# Patient Record
Sex: Female | Born: 1960
Health system: Southern US, Community
[De-identification: ages and names within clinical notes are randomized; demographics above are authoritative.]

## PROBLEM LIST (undated history)

## (undated) DIAGNOSIS — J3089 Other allergic rhinitis: Secondary | ICD-10-CM

## (undated) DIAGNOSIS — J339 Nasal polyp, unspecified: Secondary | ICD-10-CM

## (undated) DIAGNOSIS — E119 Type 2 diabetes mellitus without complications: Secondary | ICD-10-CM

## (undated) DIAGNOSIS — M1990 Unspecified osteoarthritis, unspecified site: Secondary | ICD-10-CM

## (undated) DIAGNOSIS — K56609 Unspecified intestinal obstruction, unspecified as to partial versus complete obstruction: Secondary | ICD-10-CM

## (undated) DIAGNOSIS — T4145XA Adverse effect of unspecified anesthetic, initial encounter: Secondary | ICD-10-CM

## (undated) DIAGNOSIS — I10 Essential (primary) hypertension: Secondary | ICD-10-CM

## (undated) DIAGNOSIS — T8859XA Other complications of anesthesia, initial encounter: Secondary | ICD-10-CM

## (undated) HISTORY — DX: Essential (primary) hypertension: I10

## (undated) HISTORY — PX: DEBRIDEMENT OF ABDOMINAL WALL ABSCESS: SHX6396

## (undated) HISTORY — PX: OTHER SURGICAL HISTORY: SHX169

## (undated) HISTORY — PX: CHOLECYSTECTOMY: SHX55

## (undated) HISTORY — PX: ABDOMINAL HYSTERECTOMY: SHX81

## (undated) HISTORY — DX: Unspecified osteoarthritis, unspecified site: M19.90

---

## 2013-08-28 ENCOUNTER — Ambulatory Visit (INDEPENDENT_AMBULATORY_CARE_PROVIDER_SITE_OTHER): Payer: Medicaid Other | Admitting: General Surgery

## 2013-08-28 ENCOUNTER — Encounter (INDEPENDENT_AMBULATORY_CARE_PROVIDER_SITE_OTHER): Payer: Self-pay | Admitting: General Surgery

## 2013-08-28 VITALS — BP 120/70 | HR 74 | Resp 16 | Ht 66.0 in | Wt 191.0 lb

## 2013-08-28 DIAGNOSIS — K429 Umbilical hernia without obstruction or gangrene: Secondary | ICD-10-CM

## 2013-08-28 DIAGNOSIS — K649 Unspecified hemorrhoids: Secondary | ICD-10-CM

## 2013-08-28 NOTE — Progress Notes (Signed)
Patient ID: Carrie Larson, female   DOB: 01-Mar-1961, 52 y.o.   MRN: [id number]  Chief Complaint  Patient presents with  . New Evaluation    eval hems and hernia    HPI Carrie Larson is a 52 y.o. female.  The patient is a 52 year old female who is referred by  Dr.  Teresa Pelton for evaluation of an abdominal hernia as well as hemorrhoids. Patient states she's had hemorrhoids for the last one to 2 years.  The patient also had hernia for the last 4 months. The patient the patient recently had a surgery into the approximately a year ago for what I was able to make out a potential hysterectomy. She states that 4 months after this she noticed a hernia at the umbilicus. She states she has no pain at the umbilicus.  In regards to her hemorrhoids the patient states that with her change in her diet she feels that she is more constipated with a change in her diet. The patient states she is not taking thyroid is not staying very hydrated this time. HPI  Past Medical History  Diagnosis Date  . Hypertension   . Arthritis     Past Surgical History  Procedure Laterality Date  . Cholecystectomy    . Abdominal hysterectomy      History reviewed. No pertinent family history.  Social History History  Substance Use Topics  . Smoking status: Never Smoker   . Smokeless tobacco: Never Used  . Alcohol Use: No    Allergies  Allergen Reactions  . Reglan [Metoclopramide]     Current Outpatient Prescriptions  Medication Sig Dispense Refill  . captopril (CAPOTEN) 12.5 MG tablet Take 12.5 mg by mouth 1 day or 1 dose.      . Meclizine HCl (MEDI-MECLIZINE PO) Take by mouth daily.       No current facility-administered medications for this visit.    Review of Systems Review of Systems  Constitutional: Negative.   HENT: Negative.   Respiratory: Negative.   Cardiovascular: Negative.   Gastrointestinal: Negative.   Neurological: Negative.   All other systems reviewed and are negative.    Blood  pressure 120/70, pulse 74, resp. rate 16, height 5\' 6"  (1.676 m), weight 191 lb (86.637 kg).  Physical Exam Physical Exam  Constitutional: She is oriented to person, place, and time. She appears well-developed and well-nourished.  HENT:  Head: Normocephalic and atraumatic.  Eyes: Conjunctivae and EOM are normal. Pupils are equal, round, and reactive to light.  Neck: Neck supple.  Cardiovascular: Normal rate, regular rhythm and normal heart sounds.   Pulmonary/Chest: Effort normal and breath sounds normal.  Abdominal: Soft. Bowel sounds are normal. A hernia is present.    Musculoskeletal: Normal range of motion.  Neurological: She is alert and oriented to person, place, and time.  Skin: Skin is warm and dry.    Data Reviewed None   Assessment    52 year old female with an umbilical incisional hernia.patient also with hemorrhoids.     Plan    1. Discuss with patient the pathophysiology of hemorrhoids and the need to increase her water intake as well as fiber. I believe this will help her with her hemorrhoids at this time. The patient was given a cream for hemorrhoids which he continues remain or inflamed. 2. The umbilical incisional hernia and is not give the patient a pain at this time is relatively small. I think at this time secondary to the patient's weight in the last hold off on  his surgery. Should he become more symptomatic we can discuss repair at that time. I discussed with the patient the signs and symptoms incarceration and strangulation and the need to present to the ER should these occur. The patient voiced understanding and is okay with this plan.       Marigene Ehlers., Wolfgang Finigan 08/28/2013, 2:46 PM

## 2013-09-07 ENCOUNTER — Encounter (INDEPENDENT_AMBULATORY_CARE_PROVIDER_SITE_OTHER): Payer: Self-pay

## 2013-09-22 ENCOUNTER — Encounter (INDEPENDENT_AMBULATORY_CARE_PROVIDER_SITE_OTHER): Payer: Self-pay

## 2014-02-21 DIAGNOSIS — M1990 Unspecified osteoarthritis, unspecified site: Secondary | ICD-10-CM

## 2014-02-21 HISTORY — PX: COLONOSCOPY: SHX174

## 2014-02-21 HISTORY — DX: Unspecified osteoarthritis, unspecified site: M19.90

## 2014-08-03 ENCOUNTER — Other Ambulatory Visit (HOSPITAL_BASED_OUTPATIENT_CLINIC_OR_DEPARTMENT_OTHER): Payer: Self-pay | Admitting: Internal Medicine

## 2014-08-03 DIAGNOSIS — R1084 Generalized abdominal pain: Secondary | ICD-10-CM

## 2014-08-05 ENCOUNTER — Ambulatory Visit (HOSPITAL_BASED_OUTPATIENT_CLINIC_OR_DEPARTMENT_OTHER)
Admission: RE | Admit: 2014-08-05 | Discharge: 2014-08-05 | Disposition: A | Discharge status: Home / Self Care | Payer: Self-pay | Source: Ambulatory Visit | Attending: Internal Medicine | Admitting: Internal Medicine

## 2014-08-05 DIAGNOSIS — R1084 Generalized abdominal pain: Secondary | ICD-10-CM | POA: Insufficient documentation

## 2014-11-10 ENCOUNTER — Other Ambulatory Visit: Payer: Self-pay | Admitting: Internal Medicine

## 2014-11-10 DIAGNOSIS — Z1231 Encounter for screening mammogram for malignant neoplasm of breast: Secondary | ICD-10-CM

## 2015-02-04 ENCOUNTER — Other Ambulatory Visit (HOSPITAL_COMMUNITY): Payer: Self-pay | Admitting: Internal Medicine

## 2015-02-04 DIAGNOSIS — Z1231 Encounter for screening mammogram for malignant neoplasm of breast: Secondary | ICD-10-CM

## 2015-02-16 ENCOUNTER — Ambulatory Visit (HOSPITAL_COMMUNITY)
Admission: RE | Admit: 2015-02-16 | Discharge: 2015-02-16 | Disposition: A | Discharge status: Home / Self Care | Payer: BLUE CROSS/BLUE SHIELD | Source: Ambulatory Visit | Attending: Internal Medicine | Admitting: Internal Medicine

## 2015-02-16 DIAGNOSIS — Z1231 Encounter for screening mammogram for malignant neoplasm of breast: Secondary | ICD-10-CM | POA: Diagnosis not present

## 2015-02-18 ENCOUNTER — Other Ambulatory Visit: Payer: Self-pay | Admitting: Internal Medicine

## 2015-02-18 DIAGNOSIS — R928 Other abnormal and inconclusive findings on diagnostic imaging of breast: Secondary | ICD-10-CM

## 2015-02-22 DIAGNOSIS — K56609 Unspecified intestinal obstruction, unspecified as to partial versus complete obstruction: Secondary | ICD-10-CM

## 2015-02-22 HISTORY — DX: Unspecified intestinal obstruction, unspecified as to partial versus complete obstruction: K56.609

## 2015-03-02 ENCOUNTER — Ambulatory Visit
Admission: RE | Admit: 2015-03-02 | Discharge: 2015-03-02 | Disposition: A | Discharge status: Home / Self Care | Payer: BLUE CROSS/BLUE SHIELD | Source: Ambulatory Visit | Attending: Internal Medicine | Admitting: Internal Medicine

## 2015-03-02 ENCOUNTER — Other Ambulatory Visit: Payer: Self-pay | Admitting: Internal Medicine

## 2015-03-02 DIAGNOSIS — R928 Other abnormal and inconclusive findings on diagnostic imaging of breast: Secondary | ICD-10-CM

## 2015-03-07 ENCOUNTER — Inpatient Hospital Stay (HOSPITAL_BASED_OUTPATIENT_CLINIC_OR_DEPARTMENT_OTHER)
Admission: EM | Admit: 2015-03-07 | Discharge: 2015-03-11 | DRG: 389 | Disposition: A | Discharge status: Home / Self Care | Payer: BLUE CROSS/BLUE SHIELD | Attending: Internal Medicine | Admitting: Internal Medicine

## 2015-03-07 ENCOUNTER — Encounter (HOSPITAL_BASED_OUTPATIENT_CLINIC_OR_DEPARTMENT_OTHER): Payer: Self-pay | Admitting: Emergency Medicine

## 2015-03-07 ENCOUNTER — Emergency Department (HOSPITAL_BASED_OUTPATIENT_CLINIC_OR_DEPARTMENT_OTHER): Payer: BLUE CROSS/BLUE SHIELD

## 2015-03-07 DIAGNOSIS — R109 Unspecified abdominal pain: Secondary | ICD-10-CM | POA: Diagnosis not present

## 2015-03-07 DIAGNOSIS — Z9049 Acquired absence of other specified parts of digestive tract: Secondary | ICD-10-CM | POA: Diagnosis present

## 2015-03-07 DIAGNOSIS — J339 Nasal polyp, unspecified: Secondary | ICD-10-CM | POA: Diagnosis present

## 2015-03-07 DIAGNOSIS — K5090 Crohn's disease, unspecified, without complications: Secondary | ICD-10-CM | POA: Diagnosis present

## 2015-03-07 DIAGNOSIS — N390 Urinary tract infection, site not specified: Secondary | ICD-10-CM | POA: Diagnosis present

## 2015-03-07 DIAGNOSIS — K5660 Unspecified intestinal obstruction: Secondary | ICD-10-CM | POA: Diagnosis not present

## 2015-03-07 DIAGNOSIS — E119 Type 2 diabetes mellitus without complications: Secondary | ICD-10-CM

## 2015-03-07 DIAGNOSIS — K56609 Unspecified intestinal obstruction, unspecified as to partial versus complete obstruction: Secondary | ICD-10-CM

## 2015-03-07 DIAGNOSIS — Z90710 Acquired absence of both cervix and uterus: Secondary | ICD-10-CM

## 2015-03-07 DIAGNOSIS — I10 Essential (primary) hypertension: Secondary | ICD-10-CM | POA: Diagnosis present

## 2015-03-07 DIAGNOSIS — R52 Pain, unspecified: Secondary | ICD-10-CM

## 2015-03-07 HISTORY — DX: Type 2 diabetes mellitus without complications: E11.9

## 2015-03-07 HISTORY — DX: Adverse effect of unspecified anesthetic, initial encounter: T41.45XA

## 2015-03-07 HISTORY — DX: Other allergic rhinitis: J30.89

## 2015-03-07 HISTORY — DX: Unspecified intestinal obstruction, unspecified as to partial versus complete obstruction: K56.609

## 2015-03-07 HISTORY — DX: Other complications of anesthesia, initial encounter: T88.59XA

## 2015-03-07 HISTORY — DX: Nasal polyp, unspecified: J33.9

## 2015-03-07 NOTE — ED Notes (Signed)
Pt states she has had abd pain since this morning with n/v

## 2015-03-07 NOTE — ED Provider Notes (Signed)
CSN: [id number]     Arrival date & time 03/07/15  2115 History  This chart was scribed for Carrie Selvy, MD by Freida Busman, ED Scribe. This patient was seen in room MH10/MH10 and the patient's care was started 11:18 PM.    Chief Complaint  Patient presents with  . Abdominal Pain    Patient is a 54 y.o. female presenting with abdominal pain. The history is provided by the patient. No language interpreter was used.  Abdominal Pain Pain severity:  Moderate Duration:  1 day Timing:  Constant Progression:  Worsening Chronicity:  Recurrent Relieved by:  None tried Worsened by:  Nothing tried Ineffective treatments:  None tried Associated symptoms: anorexia, nausea and vomiting   Associated symptoms: no diarrhea and no fever      HPI Comments:  Carrie Larson is a 54 y.o. female who presents to the Emergency Department complaining of constant moderate abdominal pain that started this AM. She reports decreased PO intake and associated nausea and vomiting. She denies diarrhea. No alleviating factors noted. Pt reports a h/o same pain 2 months ago but was not evaluated due to lack of insurance.  Pt is compliant with her daily meds.    Past Medical History  Diagnosis Date  . Hypertension   . Diabetes mellitus without complication    Past Surgical History  Procedure Laterality Date  . Cholecystectomy    . Abdominal hysterectomy     History reviewed. No pertinent family history. History  Substance Use Topics  . Smoking status: Never Smoker   . Smokeless tobacco: Not on file  . Alcohol Use: No   OB History    No data available     Review of Systems  Constitutional: Negative for fever.  Gastrointestinal: Positive for nausea, vomiting, abdominal pain and anorexia. Negative for diarrhea.  All other systems reviewed and are negative.     Allergies  Review of patient's allergies indicates no known allergies.  Home Medications   Prior to Admission medications   Medication  Sig Start Date End Date Taking? Authorizing Provider  LISINOPRIL PO Take by mouth.   Yes Historical Provider, MD  METFORMIN HCL PO Take by mouth.   Yes Historical Provider, MD  metoCLOPramide (REGLAN) 10 MG tablet Take 10 mg by mouth 4 (four) times daily.   Yes Historical Provider, MD   BP 132/79 mmHg  Pulse 97  Temp(Src) 98.1 F (36.7 C) (Oral)  Resp 18  Ht 5\' 7"  (1.702 m)  Wt 177 lb (80.287 kg)  BMI 27.72 kg/m2  SpO2 100% Physical Exam  Constitutional: She is oriented to person, place, and time. She appears well-developed and well-nourished. No distress.  HENT:  Head: Normocephalic and atraumatic.  Mouth/Throat: Oropharynx is clear and moist.  Eyes: Conjunctivae are normal. Pupils are equal, round, and reactive to light.  Neck: Normal range of motion. Neck supple.  Cardiovascular: Normal rate, regular rhythm and normal heart sounds.   Pulmonary/Chest: Effort normal and breath sounds normal. No respiratory distress.  Abdominal: Soft. She exhibits no distension and no mass. There is no tenderness. There is no rebound and no guarding.  Hyperactive bowel sounds throughout  Musculoskeletal: Normal range of motion.  Neurological: She is alert and oriented to person, place, and time.  Skin: Skin is warm and dry.  Psychiatric: She has a normal mood and affect. Her behavior is normal.  Nursing note and vitals reviewed.   ED Course  Procedures   DIAGNOSTIC STUDIES:  Oxygen Saturation is 100% on  RA, normal by my interpretation.    COORDINATION OF CARE:  11:24 PM Discussed treatment plan with pt at bedside and pt agreed to plan.  Labs Review Labs Reviewed  URINALYSIS, ROUTINE W REFLEX MICROSCOPIC    Imaging Review No results found.   EKG Interpretation None      MDM   Final diagnoses:  None     EKG Interpretation  Date/Time:  Tuesday March 08 2015 00:13:34 EDT Ventricular Rate:  82 PR Interval:  142 QRS Duration: 80 QT Interval:  360 QTC Calculation: 420 R  Axis:   79 Text Interpretation:  Normal sinus rhythm Confirmed by [facility]  MD, Curtez Brallier ([id number]) on 03/08/2015 12:26:24 AM      Case d/w Dr. Harlon Flor: the management of this illness is non operative Results for orders placed or performed during the hospital encounter of 03/07/15  Urinalysis, Routine w reflex microscopic  Result Value Ref Range   Color, Urine ORANGE (A) YELLOW   APPearance CLOUDY (A) CLEAR   Specific Gravity, Urine 1.037 (H) 1.005 - 1.030   pH 5.5 5.0 - 8.0   Glucose, UA NEGATIVE NEGATIVE mg/dL   Hgb urine dipstick SMALL (A) NEGATIVE   Bilirubin Urine SMALL (A) NEGATIVE   Ketones, ur >80 (A) NEGATIVE mg/dL   Protein, ur 604 (A) NEGATIVE mg/dL   Urobilinogen, UA 1.0 0.0 - 1.0 mg/dL   Nitrite NEGATIVE NEGATIVE   Leukocytes, UA SMALL (A) NEGATIVE  CBC with Differential/Platelet  Result Value Ref Range   WBC 17.5 (H) 4.0 - 10.5 K/uL   RBC 5.36 (H) 3.87 - 5.11 MIL/uL   Hemoglobin 14.8 12.0 - 15.0 g/dL   HCT 54.0 98.1 - 19.1 %   MCV 82.8 78.0 - 100.0 fL   MCH 27.6 26.0 - 34.0 pg   MCHC 33.3 30.0 - 36.0 g/dL   RDW 47.8 29.5 - 62.1 %   Platelets 410 (H) 150 - 400 K/uL   Neutrophils Relative % 90 (H) 43 - 77 %   Neutro Abs 15.8 (H) 1.7 - 7.7 K/uL   Lymphocytes Relative 4 (L) 12 - 46 %   Lymphs Abs 0.6 (L) 0.7 - 4.0 K/uL   Monocytes Relative 6 3 - 12 %   Monocytes Absolute 1.1 (H) 0.1 - 1.0 K/uL   Eosinophils Relative 0 0 - 5 %   Eosinophils Absolute 0.0 0.0 - 0.7 K/uL   Basophils Relative 0 0 - 1 %   Basophils Absolute 0.0 0.0 - 0.1 K/uL  Lipase, blood  Result Value Ref Range   Lipase 20 11 - 59 U/L  Basic metabolic panel  Result Value Ref Range   Sodium 137 135 - 145 mmol/L   Potassium 3.7 3.5 - 5.1 mmol/L   Chloride 99 96 - 112 mmol/L   CO2 27 19 - 32 mmol/L   Glucose, Bld 215 (H) 70 - 99 mg/dL   BUN 12 6 - 23 mg/dL   Creatinine, Ser 3.08 0.50 - 1.10 mg/dL   Calcium 9.5 8.4 - 65.7 mg/dL   GFR calc non Af Amer >90 >90 mL/min   GFR calc Af Amer >90 >90  mL/min   Anion gap 11 5 - 15  Troponin I  Result Value Ref Range   Troponin I <0.03 <0.031 ng/mL  Urine microscopic-add on  Result Value Ref Range   Squamous Epithelial / LPF RARE RARE   WBC, UA 0-2 <3 WBC/hpf   RBC / HPF 3-6 <3 RBC/hpf   Bacteria, UA FEW (A)  RARE   Urine-Other MUCOUS PRESENT    Ct Abdomen Pelvis W Contrast  03/08/2015   CLINICAL DATA:  Acute onset of mid abdominal pain, nausea and vomiting. Initial encounter.  EXAM: CT ABDOMEN AND PELVIS WITH CONTRAST  TECHNIQUE: Multidetector CT imaging of the abdomen and pelvis was performed using the standard protocol following bolus administration of intravenous contrast.  CONTRAST:  25mL OMNIPAQUE IOHEXOL 300 MG/ML SOLN, OMNIPAQUE IOHEXOL 300 MG/ML SOLN  COMPARISON:  Abdominal radiograph performed earlier today at 12:15 a.m.  FINDINGS: Minimal bibasilar atelectasis is noted.  The liver and spleen are unremarkable in appearance. The patient is status post cholecystectomy. The pancreas and adrenal glands are unremarkable.  The kidneys are unremarkable in appearance. There is no evidence of hydronephrosis. No renal or ureteral stones are seen. No perinephric stranding is appreciated.  There is diffuse dilatation of small bowel loops throughout the abdomen and upper pelvis, measuring up to 4.4 cm in maximal diameter. This appears to reflect focal narrowing of the small bowel just distal to the dilated mid to distal ileum, with mildly increased wall enhancement and surrounding soft tissue inflammation.  Several additional segments of increased wall enhancement and soft tissue inflammation are seen along the mid and distal ileum, both proximal and distal to the site of obstruction. This raises suspicion for an underlying inflammatory small bowel process. Acute infectious ileitis or malignancy are considered less likely. There is no definite evidence for adhesion.  Trace free fluid is seen at the right mid abdomen, and tracking adjacent to small  bowel loops. A small amount of free fluid is noted tracking along the left paracolic gutter, into the pelvis.  The stomach is within normal limits. No acute vascular abnormalities are seen.  The appendix is normal in caliber, without evidence for appendicitis. The colon is largely decompressed and grossly unremarkable in appearance.  The bladder is decompressed and not well assessed. The patient is status post hysterectomy. The ovaries are relatively symmetric. No suspicious adnexal masses are seen. No inguinal lymphadenopathy is seen.  No acute osseous abnormalities are identified.  IMPRESSION: 1. Diffuse dilatation of small bowel loops throughout the abdomen and upper pelvis, compatible with small bowel obstruction. The obstruction appears to reflect focal narrowing of the small bowel just distal to the dilated mid to distal ileum, with mildly increased wall enhancement and surrounding soft tissue inflammation. Several additional segments of focal narrowing, wall enhancement and soft tissue inflammation are seen along the mid and distal ileum, both proximal and distal to the site of obstruction. This raises suspicion for underlying inflammatory small bowel process. Acute infectious ileitis or malignancy are considered less likely. 2. Trace associated free fluid noted tracking about small bowel loops and extending into the pelvis.  These results were called by telephone at the time of interpretation on 03/08/2015 at 2:24 am to Dr. Cy Blamer, who verbally acknowledged these results.   Electronically Signed   By: Roanna Raider M.D.   On: 03/08/2015 02:24   Dg Abd Acute W/chest  03/08/2015   CLINICAL DATA:  Acute onset of central abdominal pain, with nausea and vomiting. Initial encounter.  EXAM: ACUTE ABDOMEN SERIES (ABDOMEN 2 VIEW & CHEST 1 VIEW)  COMPARISON:  None.  FINDINGS: The lungs are well-aerated and clear. There is no evidence of focal opacification, pleural effusion or pneumothorax. The  cardiomediastinal silhouette is within normal limits.  There is dilatation of small bowel loops to 4.1 cm in maximal diameter at the mid abdomen, raising concern  for some degree of bowel obstruction. Associated air-fluid levels are seen. A small amount of stool is still seen within the colon. No free intra-abdominal air is identified on the provided upright view.  No acute osseous abnormalities are seen; the sacroiliac joints are unremarkable in appearance.  IMPRESSION: 1. Dilatation of a small bowel loops to 4.1 cm, with associated air-fluid levels, at the mid abdomen. This raises concern for some degree of bowel obstruction. CT of the abdomen and pelvis would be helpful for further evaluation. No free intra-abdominal air seen. 2. No acute cardiopulmonary process identified.  These results were called by telephone at the time of interpretation on 03/08/2015 at 12:15 am to Dr. Cy Blamer, who verbally acknowledged these results.   Electronically Signed   By: Roanna Raider M.D.   On: 03/08/2015 00:16     I personally performed the services described in this documentation, which was scribed in my presence. The recorded information has been reviewed and is accurate.     Cy Blamer, MD 03/08/15 [phone]

## 2015-03-08 ENCOUNTER — Emergency Department (HOSPITAL_BASED_OUTPATIENT_CLINIC_OR_DEPARTMENT_OTHER): Payer: BLUE CROSS/BLUE SHIELD

## 2015-03-08 ENCOUNTER — Encounter (HOSPITAL_BASED_OUTPATIENT_CLINIC_OR_DEPARTMENT_OTHER): Payer: Self-pay | Admitting: Emergency Medicine

## 2015-03-08 DIAGNOSIS — I10 Essential (primary) hypertension: Secondary | ICD-10-CM | POA: Diagnosis present

## 2015-03-08 DIAGNOSIS — Z90710 Acquired absence of both cervix and uterus: Secondary | ICD-10-CM | POA: Diagnosis not present

## 2015-03-08 DIAGNOSIS — N390 Urinary tract infection, site not specified: Secondary | ICD-10-CM | POA: Diagnosis present

## 2015-03-08 DIAGNOSIS — K5669 Other intestinal obstruction: Secondary | ICD-10-CM

## 2015-03-08 DIAGNOSIS — E119 Type 2 diabetes mellitus without complications: Secondary | ICD-10-CM | POA: Diagnosis present

## 2015-03-08 DIAGNOSIS — N3000 Acute cystitis without hematuria: Secondary | ICD-10-CM

## 2015-03-08 DIAGNOSIS — J339 Nasal polyp, unspecified: Secondary | ICD-10-CM | POA: Diagnosis present

## 2015-03-08 DIAGNOSIS — K56609 Unspecified intestinal obstruction, unspecified as to partial versus complete obstruction: Secondary | ICD-10-CM | POA: Diagnosis present

## 2015-03-08 DIAGNOSIS — Z9049 Acquired absence of other specified parts of digestive tract: Secondary | ICD-10-CM | POA: Diagnosis present

## 2015-03-08 DIAGNOSIS — K5090 Crohn's disease, unspecified, without complications: Secondary | ICD-10-CM | POA: Diagnosis present

## 2015-03-08 DIAGNOSIS — R109 Unspecified abdominal pain: Secondary | ICD-10-CM | POA: Diagnosis present

## 2015-03-08 DIAGNOSIS — K5660 Unspecified intestinal obstruction: Secondary | ICD-10-CM | POA: Diagnosis present

## 2015-03-08 LAB — TROPONIN I

## 2015-03-08 LAB — URINALYSIS, ROUTINE W REFLEX MICROSCOPIC
Glucose, UA: NEGATIVE mg/dL
Nitrite: NEGATIVE
PROTEIN: 100 mg/dL — AB
Specific Gravity, Urine: 1.037 — ABNORMAL HIGH (ref 1.005–1.030)
Urobilinogen, UA: 1 mg/dL (ref 0.0–1.0)
pH: 5.5 (ref 5.0–8.0)

## 2015-03-08 LAB — URINE MICROSCOPIC-ADD ON

## 2015-03-08 LAB — GLUCOSE, CAPILLARY
GLUCOSE-CAPILLARY: 130 mg/dL — AB (ref 70–99)
GLUCOSE-CAPILLARY: 116 mg/dL — AB (ref 70–99)
Glucose-Capillary: 122 mg/dL — ABNORMAL HIGH (ref 70–99)

## 2015-03-08 LAB — BASIC METABOLIC PANEL
Anion gap: 11 (ref 5–15)
BUN: 12 mg/dL (ref 6–23)
CHLORIDE: 99 mmol/L (ref 96–112)
CO2: 27 mmol/L (ref 19–32)
CREATININE: 0.69 mg/dL (ref 0.50–1.10)
Calcium: 9.5 mg/dL (ref 8.4–10.5)
GFR calc non Af Amer: >90 mL/min (ref >90)
GLUCOSE: 215 mg/dL — AB (ref 70–99)
Potassium: 3.7 mmol/L (ref 3.5–5.1)
Sodium: 137 mmol/L (ref 135–145)

## 2015-03-08 LAB — CBC WITH DIFFERENTIAL/PLATELET
BASOS ABS: 0 10*3/uL (ref 0.0–0.1)
Basophils Relative: 0 % (ref 0–1)
Eosinophils Absolute: 0 10*3/uL (ref 0.0–0.7)
Eosinophils Relative: 0 % (ref 0–5)
HCT: 44.4 % (ref 36.0–46.0)
Hemoglobin: 14.8 g/dL (ref 12.0–15.0)
LYMPHS ABS: 0.6 10*3/uL — AB (ref 0.7–4.0)
Lymphocytes Relative: 4 % — ABNORMAL LOW (ref 12–46)
MCH: 27.6 pg (ref 26.0–34.0)
MCHC: 33.3 g/dL (ref 30.0–36.0)
MCV: 82.8 fL (ref 78.0–100.0)
Monocytes Absolute: 1.1 10*3/uL — ABNORMAL HIGH (ref 0.1–1.0)
Monocytes Relative: 6 % (ref 3–12)
NEUTROS ABS: 15.8 10*3/uL — AB (ref 1.7–7.7)
Neutrophils Relative %: 90 % — ABNORMAL HIGH (ref 43–77)
PLATELETS: 410 10*3/uL — AB (ref 150–400)
RBC: 5.36 MIL/uL — ABNORMAL HIGH (ref 3.87–5.11)
RDW: 14.4 % (ref 11.5–15.5)
WBC: 17.5 10*3/uL — ABNORMAL HIGH (ref 4.0–10.5)

## 2015-03-08 LAB — LIPASE, BLOOD: Lipase: 20 U/L (ref 11–59)

## 2015-03-08 MED ORDER — MORPHINE SULFATE 2 MG/ML IJ SOLN
2.0000 mg | INTRAMUSCULAR | Status: DC | PRN
  Administered 2015-03-08 – 2015-03-09 (×3): 2 mg via INTRAVENOUS
  Filled 2015-03-08 (×4): qty 1

## 2015-03-08 MED ORDER — MORPHINE SULFATE 4 MG/ML IJ SOLN
4.0000 mg | Freq: Once | INTRAMUSCULAR | Status: AC
  Administered 2015-03-08: 4 mg via INTRAVENOUS
  Filled 2015-03-08: qty 1

## 2015-03-08 MED ORDER — SODIUM CHLORIDE 0.9 % IV BOLUS (SEPSIS)
1000.0000 mL | Freq: Once | INTRAVENOUS | Status: AC
  Administered 2015-03-08: 1000 mL via INTRAVENOUS

## 2015-03-08 MED ORDER — LIDOCAINE VISCOUS 2 % MT SOLN: 15.0000 mL | Freq: Once | OROMUCOSAL | Status: DC

## 2015-03-08 MED ORDER — IOHEXOL 300 MG/ML  SOLN
100.0000 mL | Freq: Once | INTRAMUSCULAR | Status: AC | PRN
  Administered 2015-03-08: 100 mL via INTRAVENOUS

## 2015-03-08 MED ORDER — CHLORHEXIDINE GLUCONATE 0.12 % MT SOLN
15.0000 mL | Freq: Two times a day (BID) | OROMUCOSAL | Status: DC
  Administered 2015-03-09 – 2015-03-11 (×4): 15 mL via OROMUCOSAL
  Filled 2015-03-08 (×4): qty 15

## 2015-03-08 MED ORDER — METRONIDAZOLE IN NACL 5-0.79 MG/ML-% IV SOLN
500.0000 mg | Freq: Once | INTRAVENOUS | Status: AC
  Administered 2015-03-08: 500 mg via INTRAVENOUS
  Filled 2015-03-08: qty 100

## 2015-03-08 MED ORDER — ONDANSETRON HCL 4 MG/2ML IJ SOLN
4.0000 mg | Freq: Four times a day (QID) | INTRAMUSCULAR | Status: DC | PRN
  Administered 2015-03-08 (×2): 4 mg via INTRAVENOUS
  Filled 2015-03-08 (×2): qty 2

## 2015-03-08 MED ORDER — HYDRALAZINE HCL 20 MG/ML IJ SOLN: 10.0000 mg | Freq: Four times a day (QID) | INTRAMUSCULAR | Status: DC | PRN

## 2015-03-08 MED ORDER — ACETAMINOPHEN 325 MG PO TABS: 650.0000 mg | ORAL_TABLET | Freq: Four times a day (QID) | ORAL | Status: DC | PRN

## 2015-03-08 MED ORDER — SODIUM CHLORIDE 0.9 % IV SOLN
INTRAVENOUS | Status: DC
  Administered 2015-03-08 – 2015-03-09 (×2): via INTRAVENOUS

## 2015-03-08 MED ORDER — BENZOCAINE (TOPICAL) 20 % EX AERO
INHALATION_SPRAY | Freq: Four times a day (QID) | CUTANEOUS | Status: DC | PRN
  Filled 2015-03-08: qty 57

## 2015-03-08 MED ORDER — CIPROFLOXACIN IN D5W 400 MG/200ML IV SOLN
400.0000 mg | Freq: Once | INTRAVENOUS | Status: AC
  Administered 2015-03-08: 400 mg via INTRAVENOUS
  Filled 2015-03-08: qty 200

## 2015-03-08 MED ORDER — METRONIDAZOLE IN NACL 5-0.79 MG/ML-% IV SOLN
500.0000 mg | Freq: Three times a day (TID) | INTRAVENOUS | Status: DC
  Administered 2015-03-08 – 2015-03-10 (×6): 500 mg via INTRAVENOUS
  Filled 2015-03-08 (×8): qty 100

## 2015-03-08 MED ORDER — ONDANSETRON HCL 4 MG PO TABS: 4.0000 mg | ORAL_TABLET | Freq: Four times a day (QID) | ORAL | Status: DC | PRN

## 2015-03-08 MED ORDER — BENZOCAINE 20 % MT SOLN
OROMUCOSAL | Status: AC
  Filled 2015-03-08: qty 57

## 2015-03-08 MED ORDER — ACETAMINOPHEN 650 MG RE SUPP: 650.0000 mg | Freq: Four times a day (QID) | RECTAL | Status: DC | PRN

## 2015-03-08 MED ORDER — CETYLPYRIDINIUM CHLORIDE 0.05 % MT LIQD
7.0000 mL | Freq: Two times a day (BID) | OROMUCOSAL | Status: DC
  Administered 2015-03-08 – 2015-03-10 (×5): 7 mL via OROMUCOSAL

## 2015-03-08 MED ORDER — IOHEXOL 300 MG/ML  SOLN
25.0000 mL | Freq: Once | INTRAMUSCULAR | Status: AC | PRN
  Administered 2015-03-08: 25 mL via ORAL

## 2015-03-08 MED ORDER — SODIUM CHLORIDE 0.9 % IV SOLN
INTRAVENOUS | Status: DC
  Administered 2015-03-09 – 2015-03-10 (×3): via INTRAVENOUS
  Filled 2015-03-08 (×12): qty 1000

## 2015-03-08 MED ORDER — LIDOCAINE VISCOUS 2 % MT SOLN
OROMUCOSAL | Status: AC
  Filled 2015-03-08: qty 15

## 2015-03-08 MED ORDER — CIPROFLOXACIN IN D5W 400 MG/200ML IV SOLN
400.0000 mg | Freq: Two times a day (BID) | INTRAVENOUS | Status: DC
  Administered 2015-03-08 – 2015-03-10 (×4): 400 mg via INTRAVENOUS
  Filled 2015-03-08 (×5): qty 200

## 2015-03-08 MED ORDER — INSULIN ASPART 100 UNIT/ML ~~LOC~~ SOLN
0.0000 [IU] | SUBCUTANEOUS | Status: DC
  Administered 2015-03-08 – 2015-03-09 (×3): 2 [IU] via SUBCUTANEOUS

## 2015-03-08 MED ORDER — ENOXAPARIN SODIUM 40 MG/0.4ML ~~LOC~~ SOLN
40.0000 mg | SUBCUTANEOUS | Status: DC
  Administered 2015-03-08 – 2015-03-11 (×4): 40 mg via SUBCUTANEOUS
  Filled 2015-03-08 (×4): qty 0.4

## 2015-03-08 NOTE — ED Notes (Signed)
Multiple attempts inserting NG tube, tube was in good position, touching back of throat and when to bottom of back of throat, pt grabs the tube and rips it out of her nose. Pt agreed for [place] to hold her hands and she still ripped tube out of nose while jerking head to the side. Informed pt that if/when she was ready to try again to push call light. Also informed pt to call if she needed anything else. Dr Saralyn Pilar notified that unable to place NG tube due to pt intolerability.

## 2015-03-08 NOTE — Progress Notes (Signed)
Interpreter Graciela Namihira for RN Nancy. Admiting 

## 2015-03-08 NOTE — Progress Notes (Signed)
Interpreter Graciela Namihira for RN  °

## 2015-03-08 NOTE — Progress Notes (Signed)
Request for medical records faxed to [facility].

## 2015-03-08 NOTE — H&P (Addendum)
[facility] History and Physical  Carrie Larson JSE:[id number] DOB: 11/23/1960 DOA: 03/07/2015  Referring physician: Dr Nicanor Alcon PCP: No primary care provider on file.   Chief Complaint:  Abdominal pain with N/V x 1 day   history taken with the help of  spanish interpreter  HPI:  54 y/o hispanic female with past medical hx of hypertension, type 2 diabetes mellitus, history of abdominal cholecystectomy,  abdominal hysterectomy and cesarean section who presented to med [facility] with abdominal pain since yesterday morning. Patient reports having acute onset of crampy abdominal pain mainly over the epigastric area radiating down towards the periumbilical and left quadrant. This was associated with several episodes of vomiting initially of food particles and then bilious liquid. She denies fever but reports chills and some burning urination. She reports having similar abdominal pain the past but has not required any hospitalization. She also reports history of umbilical hernia and was referred to surgery by her PCP. Patient reports 2 episodes of watery greenish  diarrhea this morning. Patient denies headache, dizziness, fever, chest pain, palpitations, SOB, denies change in weight. Denies any sick contacts or recent travel. Denies eating anything outside.  Course in the ED Patient was afebrile. Blood will done showed leukocytosis with WBC of 17.5, normal hemoglobin and platelets. Chemistry was unremarkable. Lipase was normal. UA was suggestive of UTI. A CT scan of the abdomen and pelvis was done which showed diffuse dilatation of small bowel loops throughout the abdomen and upper pelvis suggestive of small bowel obstruction with focal narrowing of the small bowel just distal to dilated mid to distal ileum. Also sore several additional segments of focal narrowing with wall enhancement and soft tissue inflammation along the mid and distal ileum raising suspicion for underlying deformity  small bowel process. Patient given a dose of IV ciprofloxacin and hospitalist admission requested to medical floor.    Review of Systems:  Constitutional: Chills +, Denies fever,  diaphoresis, appetite change and fatigue.  HEENT: Denies visual or hearing symptoms, congestion, difficulty swallowing, neck pain or stiffness Respiratory: Denies SOB, DOE, cough, chest tightness,  and wheezing.   Cardiovascular: Denies chest pain, palpitations and leg swelling.  Gastrointestinal: nausea, vomiting, abdominal pain, diarrhea, abdominal distention denies constipation, blood in stool  Genitourinary:  dysuria+, denies urgency, frequency, hematuria, flank pain and difficulty urinating.  Endocrine: Denies: hot or cold intolerance,  polyuria, polydipsia. Musculoskeletal: Denies myalgias, back pain, joint pain or swelling Skin: Deniesrash and wound.  Neurological: Denies dizziness, syncope, weakness, light-headedness, numbness and headaches.  Hematological: Denies adenopathy. Psychiatric/Behavioral: Denies confusion  Past Medical History  Diagnosis Date  . Hypertension   . Diabetes mellitus without complication    Past Surgical History  Procedure Laterality Date  . Cholecystectomy    . Abdominal hysterectomy     Social History:  reports that she has never smoked. She does not have any smokeless tobacco history on file. She reports that she does not drink alcohol or use illicit drugs.  No Known Allergies  Family history Mother had diabetes.  Prior to Admission medications   Medication Sig Start Date End Date Taking? Authorizing Provider  LISINOPRIL PO Take by mouth.   Yes Historical Provider, MD  METFORMIN HCL PO Take by mouth.   Yes Historical Provider, MD  metoCLOPramide (REGLAN) 10 MG tablet Take 10 mg by mouth 4 (four) times daily.   Yes Historical Provider, MD     Physical Exam:  Filed Vitals:   03/08/15 0737 03/08/15 0319 03/08/15 0550 03/08/15  1610  BP: 126/71 121/76 119/71  131/71  Pulse: 98 100 86 84  Temp:    98.2 F (36.8 C)  TempSrc:    Oral  Resp: Height:      Weight:      SpO2: 100% 100% 98% 98%    Constitutional: Vital signs reviewed. Middle aged female in no acute distress HEENT: no pallor, no icterus, dry oral mucosa, no cervical lymphadenopathy Cardiovascular: RRR, S1 normal, S2 normal, no MRG Chest: CTAB, no wheezes, rales, or rhonchi Abdominal: 2 midline surgical scars . Soft. Nondistended, bowel sounds sluggish, diffuse tenderness to palpation Ext: warm, no edema Neurological: Alert and oriented  Labs on Admission:  Basic Metabolic Panel:  Recent Labs Lab 03/08/15 0039  NA 137  K 3.7  CL 99  CO2 27  GLUCOSE 215*  BUN 12  CREATININE 0.69  CALCIUM 9.5   Liver Function Tests: No results for input(s): AST, ALT, ALKPHOS, BILITOT, PROT, ALBUMIN in the last 168 hours.  Recent Labs Lab 03/08/15 0039  LIPASE 20   No results for input(s): AMMONIA in the last 168 hours. CBC:  Recent Labs Lab 03/08/15 0039  WBC 17.5*  NEUTROABS 15.8*  HGB 14.8  HCT 44.4  MCV 82.8  PLT 410*   Cardiac Enzymes:  Recent Labs Lab 03/08/15 0039  TROPONINI <0.03   BNP: Invalid input(s): POCBNP CBG: No results for input(s): GLUCAP in the last 168 hours.  Radiological Exams on Admission: Ct Abdomen Pelvis W Contrast  03/08/2015   CLINICAL DATA:  Acute onset of mid abdominal pain, nausea and vomiting. Initial encounter.  EXAM: CT ABDOMEN AND PELVIS WITH CONTRAST  TECHNIQUE: Multidetector CT imaging of the abdomen and pelvis was performed using the standard protocol following bolus administration of intravenous contrast.  CONTRAST:  25mL OMNIPAQUE IOHEXOL 300 MG/ML SOLN, OMNIPAQUE IOHEXOL 300 MG/ML SOLN  COMPARISON:  Abdominal radiograph performed earlier today at 12:15 a.m.  FINDINGS: Minimal bibasilar atelectasis is noted.  The liver and spleen are unremarkable in appearance. The patient is status post cholecystectomy. The  pancreas and adrenal glands are unremarkable.  The kidneys are unremarkable in appearance. There is no evidence of hydronephrosis. No renal or ureteral stones are seen. No perinephric stranding is appreciated.  There is diffuse dilatation of small bowel loops throughout the abdomen and upper pelvis, measuring up to 4.4 cm in maximal diameter. This appears to reflect focal narrowing of the small bowel just distal to the dilated mid to distal ileum, with mildly increased wall enhancement and surrounding soft tissue inflammation.  Several additional segments of increased wall enhancement and soft tissue inflammation are seen along the mid and distal ileum, both proximal and distal to the site of obstruction. This raises suspicion for an underlying inflammatory small bowel process. Acute infectious ileitis or malignancy are considered less likely. There is no definite evidence for adhesion.  Trace free fluid is seen at the right mid abdomen, and tracking adjacent to small bowel loops. A small amount of free fluid is noted tracking along the left paracolic gutter, into the pelvis.  The stomach is within normal limits. No acute vascular abnormalities are seen.  The appendix is normal in caliber, without evidence for appendicitis. The colon is largely decompressed and grossly unremarkable in appearance.  The bladder is decompressed and not well assessed. The patient is status post hysterectomy. The ovaries are relatively symmetric. No suspicious adnexal masses are seen. No inguinal lymphadenopathy is seen.  No acute osseous  abnormalities are identified.  IMPRESSION: 1. Diffuse dilatation of small bowel loops throughout the abdomen and upper pelvis, compatible with small bowel obstruction. The obstruction appears to reflect focal narrowing of the small bowel just distal to the dilated mid to distal ileum, with mildly increased wall enhancement and surrounding soft tissue inflammation. Several additional segments of focal  narrowing, wall enhancement and soft tissue inflammation are seen along the mid and distal ileum, both proximal and distal to the site of obstruction. This raises suspicion for underlying inflammatory small bowel process. Acute infectious ileitis or malignancy are considered less likely. 2. Trace associated free fluid noted tracking about small bowel loops and extending into the pelvis.  These results were called by telephone at the time of interpretation on 03/08/2015 at 2:24 am to Dr. Cy Blamer, who verbally acknowledged these results.   Electronically Signed   By: Roanna Raider M.D.   On: 03/08/2015 02:24   Dg Abd Acute W/chest  03/08/2015   CLINICAL DATA:  Acute onset of central abdominal pain, with nausea and vomiting. Initial encounter.  EXAM: ACUTE ABDOMEN SERIES (ABDOMEN 2 VIEW & CHEST 1 VIEW)  COMPARISON:  None.  FINDINGS: The lungs are well-aerated and clear. There is no evidence of focal opacification, pleural effusion or pneumothorax. The cardiomediastinal silhouette is within normal limits.  There is dilatation of small bowel loops to 4.1 cm in maximal diameter at the mid abdomen, raising concern for some degree of bowel obstruction. Associated air-fluid levels are seen. A small amount of stool is still seen within the colon. No free intra-abdominal air is identified on the provided upright view.  No acute osseous abnormalities are seen; the sacroiliac joints are unremarkable in appearance.  IMPRESSION: 1. Dilatation of a small bowel loops to 4.1 cm, with associated air-fluid levels, at the mid abdomen. This raises concern for some degree of bowel obstruction. CT of the abdomen and pelvis would be helpful for further evaluation. No free intra-abdominal air seen. 2. No acute cardiopulmonary process identified.  These results were called by telephone at the time of interpretation on 03/08/2015 at 12:15 am to Dr. Cy Blamer, who verbally acknowledged these results.   Electronically Signed   By:  Roanna Raider M.D.   On: 03/08/2015 00:16    EKG: none  Assessment/Plan   Small bowel obstrcution Possibly secondary to reasons from prior surgery. Lying infectious or inflammatory process not excluded entirely.  admit to med surg  strict NPO -Patient refused NG tube placement stating she has nasal polyps and causes a lot of discomfort. NG weas placed at [facility] but she pulled it out.  serial abd exam. repeat abd xray in am. Supportive care with IVF, pain meds and antiemetics. Check stool for C. difficile and GI pathogen panel. Empiric Cipro and Flagyl.  surgery consult  leucocytosis   associated with SBO. Has UTI as well On empiric ciprofloxacin.  Type 2 DM  hod home meds. Monitor on SSI with q 4hr accuchecks  essential HTN  stable. Place on prn IV hydralazine  Diet:cardiac  DVT prophylaxis: sq lovenox   Code Status: full code Family Communication: None at bedside Disposition Plan: admit to med surg  Eddie North [facility] Pager [phone]  Total time spent on admission :55 minutes  If 7PM-7AM, please contact night-coverage [email or web address] Password [facility] 03/08/2015, 9:49 AM

## 2015-03-08 NOTE — Consult Note (Signed)
Carrie Larson 11/23/1960  [id number].   Requesting MD: Dr. Flonnie Overman Dhungel Chief Complaint/Reason for Consult: sbo HPI: This is a 54 yo Hispanic female who doesn't speak any English who began having crampy abdominal pain 2 days ago.  She began having some nausea and vomiting as well.  She has been having some diarrhea as well.  She has had 2 loose stools today.  She does admit to having lost 23lbs unintentionally over the last 5yrand 9 months since being in this country.  Apparently, she had "colitis" in February of 2015 and [facility]  She did have a colonoscopy then as well for which she states her biopsy of her colon revealed colitis.    She went to the ED yesterday where she had a CT scan that reveals wall thickening of her mid to distal ileum with surrounding tissue inflammation and dilated small bowel c/w bowel obstruction.  There are several other areas of wall enhancement and focal narrowing raising concern for IBD, acute infectious ileitis, or malignancy.  We have been asked to see this patient for further recommendations.  ROS : Please see HPI, otherwise all other systems are negative  Family History: Sister's son has Crohn's disease  Past Medical History  Diagnosis Date  . Hypertension   . Diabetes mellitus without complication     Past Surgical History  Procedure Laterality Date  . Cholecystectomy    . Abdominal hysterectomy    . Debridement of abdominal wall abscess    . Cesarean section      Social History:  reports that she has never smoked. She does not have any smokeless tobacco history on file. She reports that she does not drink alcohol or use illicit drugs.  Allergies: No Known Allergies  Medications Prior to Admission  Medication Sig Dispense Refill  . LISINOPRIL PO Take by mouth.    . METFORMIN HCL PO Take by mouth.    . metoCLOPramide (REGLAN) 10 MG tablet Take 10 mg by mouth 4 (four) times daily.      Blood pressure  131/71, pulse 84, temperature 98.2 F (36.8 C), temperature source Oral, resp. rate 19, height _0  (1.702 m), weight 80.287 kg (177 lb), SpO2 98 %. Physical Exam: General: pleasant, mildly obese Hispanic female who is laying in bed in NAD HEENT: head is normocephalic, atraumatic.  Sclera are noninjected.  PERRL.  Ears and nose without any masses or lesions.  Mouth is pink and moist Heart: regular, rate, and rhythm.  Normal s1,s2. No obvious murmurs, gallops, or rubs noted.  Palpable radial and pedal pulses bilaterally Lungs: CTAB, no wheezes, rhonchi, or rales noted.  Respiratory effort nonlabored Abd: soft, mild tenderness mostly on the left side of the abdomen, ND, +BS, no masses, hernias, or organomegaly.  Multiple scars.  Lower midline and right paramedian incision from cholecystectomy MS: all 4 extremities are symmetrical with no cyanosis, clubbing, or edema. Skin: warm and dry with no masses, lesions, or rashes Psych: A&Ox3 with an appropriate affect.    Results for orders placed or performed during the hospital encounter of 03/07/15 (from the past 48 hour(s))  Urinalysis, Routine w reflex microscopic     Status: Abnormal   Collection Time: 03/08/15 12:00 AM  Result Value Ref Range   Color, Urine ORANGE (A) YELLOW    Comment: BIOCHEMICALS MAY BE AFFECTED BY COLOR   APPearance CLOUDY (A) CLEAR   Specific Gravity, Urine 1.037 (H) 1.005 - 1.030   pH  5.5 5.0 - 8.0   Glucose, UA NEGATIVE NEGATIVE mg/dL   Hgb urine dipstick SMALL (A) NEGATIVE   Bilirubin Urine SMALL (A) NEGATIVE   Ketones, ur >80 (A) NEGATIVE mg/dL   Protein, ur 100 (A) NEGATIVE mg/dL   Urobilinogen, UA 1.0 0.0 - 1.0 mg/dL   Nitrite NEGATIVE NEGATIVE   Leukocytes, UA SMALL (A) NEGATIVE  Urine microscopic-add on     Status: Abnormal   Collection Time: 03/08/15 12:00 AM  Result Value Ref Range   Squamous Epithelial / LPF RARE RARE   WBC, UA 0-2 <3 WBC/hpf   RBC / HPF 3-6 <3 RBC/hpf   Bacteria, UA FEW (A) RARE    Urine-Other MUCOUS PRESENT   CBC with Differential/Platelet     Status: Abnormal   Collection Time: 03/08/15 12:39 AM  Result Value Ref Range   WBC 17.5 (H) 4.0 - 10.5 K/uL   RBC 5.36 (H) 3.87 - 5.11 MIL/uL   Hemoglobin 14.8 12.0 - 15.0 g/dL   HCT 44.4 36.0 - 46.0 %   MCV 82.8 78.0 - 100.0 fL   MCH 27.6 26.0 - 34.0 pg   MCHC 33.3 30.0 - 36.0 g/dL   RDW 14.4 11.5 - 15.5 %   Platelets 410 (H) 150 - 400 K/uL   Neutrophils Relative % 90 (H) 43 - 77 %   Neutro Abs 15.8 (H) 1.7 - 7.7 K/uL   Lymphocytes Relative 4 (L) 12 - 46 %   Lymphs Abs 0.6 (L) 0.7 - 4.0 K/uL   Monocytes Relative 6 3 - 12 %   Monocytes Absolute 1.1 (H) 0.1 - 1.0 K/uL   Eosinophils Relative 0 0 - 5 %   Eosinophils Absolute 0.0 0.0 - 0.7 K/uL   Basophils Relative 0 0 - 1 %   Basophils Absolute 0.0 0.0 - 0.1 K/uL  Lipase, blood     Status: None   Collection Time: 03/08/15 12:39 AM  Result Value Ref Range   Lipase 20 11 - 59 U/L  Basic metabolic panel     Status: Abnormal   Collection Time: 03/08/15 12:39 AM  Result Value Ref Range   Sodium 137 135 - 145 mmol/L   Potassium 3.7 3.5 - 5.1 mmol/L   Chloride 99 96 - 112 mmol/L   CO2 27 19 - 32 mmol/L   Glucose, Bld 215 (H) 70 - 99 mg/dL   BUN 12 6 - 23 mg/dL   Creatinine, Ser 0.69 0.50 - 1.10 mg/dL   Calcium 9.5 8.4 - 10.5 mg/dL   GFR calc non Af Amer >90 >90 mL/min   GFR calc Af Amer >90 >90 mL/min    Comment: (NOTE) The eGFR has been calculated using the CKD EPI equation. This calculation has not been validated in all clinical situations. eGFR's persistently <90 mL/min signify possible Chronic Kidney Disease.    Anion gap 11 5 - 15  Troponin I     Status: None   Collection Time: 03/08/15 12:39 AM  Result Value Ref Range   Troponin I <0.03 <0.031 ng/mL    Comment:        NO INDICATION OF MYOCARDIAL INJURY.    Ct Abdomen Pelvis W Contrast  03/08/2015   CLINICAL DATA:  Acute onset of mid abdominal pain, nausea and vomiting. Initial encounter.  EXAM: CT  ABDOMEN AND PELVIS WITH CONTRAST  TECHNIQUE: Multidetector CT imaging of the abdomen and pelvis was performed using the standard protocol following bolus administration of intravenous contrast.  CONTRAST:  38m  OMNIPAQUE IOHEXOL 300 MG/ML SOLN, 119m OMNIPAQUE IOHEXOL 300 MG/ML SOLN  COMPARISON:  Abdominal radiograph performed earlier today at 12:15 a.m.  FINDINGS: Minimal bibasilar atelectasis is noted.  The liver and spleen are unremarkable in appearance. The patient is status post cholecystectomy. The pancreas and adrenal glands are unremarkable.  The kidneys are unremarkable in appearance. There is no evidence of hydronephrosis. No renal or ureteral stones are seen. No perinephric stranding is appreciated.  There is diffuse dilatation of small bowel loops throughout the abdomen and upper pelvis, measuring up to 4.4 cm in maximal diameter. This appears to reflect focal narrowing of the small bowel just distal to the dilated mid to distal ileum, with mildly increased wall enhancement and surrounding soft tissue inflammation.  Several additional segments of increased wall enhancement and soft tissue inflammation are seen along the mid and distal ileum, both proximal and distal to the site of obstruction. This raises suspicion for an underlying inflammatory small bowel process. Acute infectious ileitis or malignancy are considered less likely. There is no definite evidence for adhesion.  Trace free fluid is seen at the right mid abdomen, and tracking adjacent to small bowel loops. A small amount of free fluid is noted tracking along the left paracolic gutter, into the pelvis.  The stomach is within normal limits. No acute vascular abnormalities are seen.  The appendix is normal in caliber, without evidence for appendicitis. The colon is largely decompressed and grossly unremarkable in appearance.  The bladder is decompressed and not well assessed. The patient is status post hysterectomy. The ovaries are relatively  symmetric. No suspicious adnexal masses are seen. No inguinal lymphadenopathy is seen.  No acute osseous abnormalities are identified.  IMPRESSION: 1. Diffuse dilatation of small bowel loops throughout the abdomen and upper pelvis, compatible with small bowel obstruction. The obstruction appears to reflect focal narrowing of the small bowel just distal to the dilated mid to distal ileum, with mildly increased wall enhancement and surrounding soft tissue inflammation. Several additional segments of focal narrowing, wall enhancement and soft tissue inflammation are seen along the mid and distal ileum, both proximal and distal to the site of obstruction. This raises suspicion for underlying inflammatory small bowel process. Acute infectious ileitis or malignancy are considered less likely. 2. Trace associated free fluid noted tracking about small bowel loops and extending into the pelvis.  These results were called by telephone at the time of interpretation on 03/08/2015 at 2:24 am to Dr. AVeatrice Kells who verbally acknowledged these results.   Electronically Signed   By: JGarald BaldingM.D.   On: 03/08/2015 02:24   Dg Abd Acute W/chest  03/08/2015   CLINICAL DATA:  Acute onset of central abdominal pain, with nausea and vomiting. Initial encounter.  EXAM: ACUTE ABDOMEN SERIES (ABDOMEN 2 VIEW & CHEST 1 VIEW)  COMPARISON:  None.  FINDINGS: The lungs are well-aerated and clear. There is no evidence of focal opacification, pleural effusion or pneumothorax. The cardiomediastinal silhouette is within normal limits.  There is dilatation of small bowel loops to 4.1 cm in maximal diameter at the mid abdomen, raising concern for some degree of bowel obstruction. Associated air-fluid levels are seen. A small amount of stool is still seen within the colon. No free intra-abdominal air is identified on the provided upright view.  No acute osseous abnormalities are seen; the sacroiliac joints are unremarkable in appearance.   IMPRESSION: 1. Dilatation of a small bowel loops to 4.1 cm, with associated air-fluid levels, at the mid  abdomen. This raises concern for some degree of bowel obstruction. CT of the abdomen and pelvis would be helpful for further evaluation. No free intra-abdominal air seen. 2. No acute cardiopulmonary process identified.  These results were called by telephone at the time of interpretation on 03/08/2015 at 12:15 am to Dr. Veatrice Kells, who verbally acknowledged these results.   Electronically Signed   By: Garald Balding M.D.   On: 03/08/2015 00:16       Assessment/Plan 1. Partial small bowel obstruction secondary to small bowel thickening -? IBD, infection ileitis, vs malignancy -will obtain medical records from [facility] to determine what her colonoscopy showed a year ago and what her biopsies revealed.  It sounds like she had her colon biopsied and not her small bowel.  I am suspicious that the patient has something more going on than a partial obstruction secondary to adhesive disease.  She likely would benefit from GI consultation as well for further recommendations for work up.   - NGT would be beneficial, but the patient refuses and will not keep one in place.  Cont NPO and IVFs for hydration -follow up films in the morning. -cont conservative management at this point. -agree with abx therapy given elevated WBC -will follow along  Cyril Railey E 03/08/2015, 11:02 AM Pager: [phone]

## 2015-03-08 NOTE — ED Notes (Addendum)
Attempted to offer multiple medication to numb throat to make insertion better. Patient refused after multiple prompts. The patient reported that she was ready. !st attempt made and patient pulled tube out, 2nd attempt while holding hands stopped because patient refused due to question of nare to use. Patient and husband refusing further attempts.

## 2015-03-08 NOTE — Progress Notes (Addendum)
Received patient from [facility] ED via [facility] around [phone]. Patient AOx3.  Ambulatory and no complain of pain as of this time.  VS stable.  Admitting [facility] doctor paged.

## 2015-03-09 ENCOUNTER — Inpatient Hospital Stay (HOSPITAL_COMMUNITY): Payer: BLUE CROSS/BLUE SHIELD

## 2015-03-09 ENCOUNTER — Encounter (HOSPITAL_COMMUNITY): Payer: Self-pay | Admitting: Physician Assistant

## 2015-03-09 DIAGNOSIS — E118 Type 2 diabetes mellitus with unspecified complications: Secondary | ICD-10-CM

## 2015-03-09 DIAGNOSIS — R1084 Generalized abdominal pain: Secondary | ICD-10-CM

## 2015-03-09 DIAGNOSIS — R935 Abnormal findings on diagnostic imaging of other abdominal regions, including retroperitoneum: Secondary | ICD-10-CM

## 2015-03-09 LAB — BASIC METABOLIC PANEL
ANION GAP: 4 — AB (ref 5–15)
BUN: 11 mg/dL (ref 6–23)
CALCIUM: 8.2 mg/dL — AB (ref 8.4–10.5)
CHLORIDE: 108 mmol/L (ref 96–112)
CO2: 28 mmol/L (ref 19–32)
CREATININE: 0.84 mg/dL (ref 0.50–1.10)
GFR calc Af Amer: >90 mL/min (ref >90)
GFR, EST NON AFRICAN AMERICAN: 78 mL/min — AB (ref >90)
Glucose, Bld: 161 mg/dL — ABNORMAL HIGH (ref 70–99)
Potassium: 3.8 mmol/L (ref 3.5–5.1)
Sodium: 140 mmol/L (ref 135–145)

## 2015-03-09 LAB — CBC
HCT: 35.6 % — ABNORMAL LOW (ref 36.0–46.0)
Hemoglobin: 11.3 g/dL — ABNORMAL LOW (ref 12.0–15.0)
MCH: 27.1 pg (ref 26.0–34.0)
MCHC: 31.7 g/dL (ref 30.0–36.0)
MCV: 85.4 fL (ref 78.0–100.0)
PLATELETS: 281 10*3/uL (ref 150–400)
RBC: 4.17 MIL/uL (ref 3.87–5.11)
RDW: 14.7 % (ref 11.5–15.5)
WBC: 6.7 10*3/uL (ref 4.0–10.5)

## 2015-03-09 LAB — GLUCOSE, CAPILLARY
GLUCOSE-CAPILLARY: 179 mg/dL — AB (ref 70–99)
GLUCOSE-CAPILLARY: 79 mg/dL (ref 70–99)
GLUCOSE-CAPILLARY: 82 mg/dL (ref 70–99)
GLUCOSE-CAPILLARY: 93 mg/dL (ref 70–99)
GLUCOSE-CAPILLARY: 96 mg/dL (ref 70–99)
Glucose-Capillary: 94 mg/dL (ref 70–99)
Glucose-Capillary: 128 mg/dL — ABNORMAL HIGH (ref 70–99)
Glucose-Capillary: 86 mg/dL (ref 70–99)

## 2015-03-09 LAB — URINE CULTURE
Colony Count: NO GROWTH
Culture: NO GROWTH
Special Requests: NORMAL

## 2015-03-09 LAB — CLOSTRIDIUM DIFFICILE BY PCR: Toxigenic C. Difficile by PCR: NEGATIVE

## 2015-03-09 NOTE — Progress Notes (Signed)
Requested records from [facility] regional via fax.

## 2015-03-09 NOTE — Progress Notes (Signed)
PROGRESS NOTE  Carrie Larson ZOX:[id number] DOB: 01-Sep-1961 DOA: 03/07/2015 PCP: Jackie Plum, MD  HPI: 54 y/o hispanic female with past medical hx of hypertension, type 2 diabetes mellitus, history of abdominal cholecystectomy, abdominal hysterectomy and cesarean section who presented to med [facility] with abdominal pain since yesterday morning. A CT scan of the abdomen and pelvis was done which showed diffuse dilatation of small bowel loops throughout the abdomen and upper pelvis suggestive of small bowel obstruction with focal narrowing of the small bowel just distal to dilated mid to distal ileum. General surgery consulted.   Subjective / 24 H Interval events  History obtained via spanish interpreter over the phone.  - feeling better this morning, feels a lot of gas - denies chest pain/shortness of breath   Assessment/Plan: Principal Problem:   SBO (small bowel obstruction) Active Problems:   Small bowel obstruction   Type 2 diabetes mellitus   Essential hypertension   UTI (urinary tract infection)   SBO  - general surgery consulted, appreciate input - discussed with surgery this morning, less likely adhesions based on their impression and high suspicion for inflammatory component. She has a family history of Chron's disease - GI consulted as well today  - continue NPO  - Supportive care with IVF, pain meds and antiemetics  - Check stool for C. difficile and GI pathogen panel  - Empiric Cipro and Flagyl   Ileitis  - infectious vs inflammatory - has family history of Crohn's disease - patient was seen by GI in [place] about a year ago when she had a colonoscopy, she was told that she has colitis and internal hemorrhoids, nothing more.  - in process of obtaining records.  Leucocytosis  -associated with SBO. Has UTI as well - resolved this morning on antibiotics  Type 2 DM -hod home meds. Monitor on SSI with q 4hr accuchecks  Essential HTN -  stable. Place on prn IV hydralazine   Diet: Diet NPO time specified Except for: Ice Chips, Other (See Comments) Fluids: NS DVT Prophylaxis: Lovenox  Code Status: Full Code Family Communication: d/w family bedside  Disposition Plan: remain inpatient  Consultants:  General surgery  GI  Procedures:  None    Antibiotics Ciprofloxacin 3/15 >> Metronidazole 3/15 >>   Studies  Ct Abdomen Pelvis W Contrast  03/08/2015   CLINICAL DATA:  Acute onset of mid abdominal pain, nausea and vomiting. Initial encounter.  EXAM: CT ABDOMEN AND PELVIS WITH CONTRAST  TECHNIQUE: Multidetector CT imaging of the abdomen and pelvis was performed using the standard protocol following bolus administration of intravenous contrast.  CONTRAST:  25mL OMNIPAQUE IOHEXOL 300 MG/ML SOLN, OMNIPAQUE IOHEXOL 300 MG/ML SOLN  COMPARISON:  Abdominal radiograph performed earlier today at 12:15 a.m.  FINDINGS: Minimal bibasilar atelectasis is noted.  The liver and spleen are unremarkable in appearance. The patient is status post cholecystectomy. The pancreas and adrenal glands are unremarkable.  The kidneys are unremarkable in appearance. There is no evidence of hydronephrosis. No renal or ureteral stones are seen. No perinephric stranding is appreciated.  There is diffuse dilatation of small bowel loops throughout the abdomen and upper pelvis, measuring up to 4.4 cm in maximal diameter. This appears to reflect focal narrowing of the small bowel just distal to the dilated mid to distal ileum, with mildly increased wall enhancement and surrounding soft tissue inflammation.  Several additional segments of increased wall enhancement and soft tissue inflammation are seen along the mid and distal ileum, both proximal  and distal to the site of obstruction. This raises suspicion for an underlying inflammatory small bowel process. Acute infectious ileitis or malignancy are considered less likely. There is no definite evidence for  adhesion.  Trace free fluid is seen at the right mid abdomen, and tracking adjacent to small bowel loops. A small amount of free fluid is noted tracking along the left paracolic gutter, into the pelvis.  The stomach is within normal limits. No acute vascular abnormalities are seen.  The appendix is normal in caliber, without evidence for appendicitis. The colon is largely decompressed and grossly unremarkable in appearance.  The bladder is decompressed and not well assessed. The patient is status post hysterectomy. The ovaries are relatively symmetric. No suspicious adnexal masses are seen. No inguinal lymphadenopathy is seen.  No acute osseous abnormalities are identified.  IMPRESSION: 1. Diffuse dilatation of small bowel loops throughout the abdomen and upper pelvis, compatible with small bowel obstruction. The obstruction appears to reflect focal narrowing of the small bowel just distal to the dilated mid to distal ileum, with mildly increased wall enhancement and surrounding soft tissue inflammation. Several additional segments of focal narrowing, wall enhancement and soft tissue inflammation are seen along the mid and distal ileum, both proximal and distal to the site of obstruction. This raises suspicion for underlying inflammatory small bowel process. Acute infectious ileitis or malignancy are considered less likely. 2. Trace associated free fluid noted tracking about small bowel loops and extending into the pelvis.  These results were called by telephone at the time of interpretation on 03/08/2015 at 2:24 am to Dr. Cy Blamer, who verbally acknowledged these results.   Electronically Signed   By: Roanna Raider M.D.   On: 03/08/2015 02:24   Dg Abd 2 Views  03/09/2015   CLINICAL DATA:  Subsequent evaluation of abdominal discomfort since yesterday with diagnosis of small bowel obstruction  EXAM: ABDOMEN - 2 VIEW  COMPARISON:  03/08/2015  FINDINGS: Dilated loops of bowel in the left abdomen measuring up to  4 cm and demonstrating air-fluid levels. Other nondistended loops of small bowel in the mid abdomen are identified which also demonstrate air-fluid levels. The colon is decompressed with minimal gas. There is no free air. The degree of distention is not significantly different when compared to the prior study.  IMPRESSION: Small bowel obstruction with similar features as compared to prior radiograph.   Electronically Signed   By: Esperanza Heir M.D.   On: 03/09/2015 08:17   Dg Abd Acute W/chest  03/08/2015   CLINICAL DATA:  Acute onset of central abdominal pain, with nausea and vomiting. Initial encounter.  EXAM: ACUTE ABDOMEN SERIES (ABDOMEN 2 VIEW & CHEST 1 VIEW)  COMPARISON:  None.  FINDINGS: The lungs are well-aerated and clear. There is no evidence of focal opacification, pleural effusion or pneumothorax. The cardiomediastinal silhouette is within normal limits.  There is dilatation of small bowel loops to 4.1 cm in maximal diameter at the mid abdomen, raising concern for some degree of bowel obstruction. Associated air-fluid levels are seen. A small amount of stool is still seen within the colon. No free intra-abdominal air is identified on the provided upright view.  No acute osseous abnormalities are seen; the sacroiliac joints are unremarkable in appearance.  IMPRESSION: 1. Dilatation of a small bowel loops to 4.1 cm, with associated air-fluid levels, at the mid abdomen. This raises concern for some degree of bowel obstruction. CT of the abdomen and pelvis would be helpful for further evaluation. No  free intra-abdominal air seen. 2. No acute cardiopulmonary process identified.  These results were called by telephone at the time of interpretation on 03/08/2015 at 12:15 am to Dr. Cy Blamer, who verbally acknowledged these results.   Electronically Signed   By: Roanna Raider M.D.   On: 03/08/2015 00:16   Objective  Filed Vitals:   03/08/15 1610 03/08/15 1441 03/08/15 2255 03/09/15 0513  BP:  131/71 128/76 101/45 107/48  Pulse: 84 90 67 68  Temp: 98.2 F (36.8 C) 98 F (36.7 C) 98.5 F (36.9 C) 98.6 F (37 C)  TempSrc: Oral Oral Oral Oral  Resp: Height:      Weight:      SpO2: 98% 99% 100% 99%    Intake/Output Summary (Last 24 hours) at 03/09/15 1318 Last data filed at 03/09/15 0700  Gross per 24 hour  Intake   1755 ml  Output      0 ml  Net   1755 ml   Filed Weights   03/07/15 2127  Weight: 80.287 kg (177 lb)   Exam:  General:  NAD  HEENT: no scleral icterus  Cardiovascular: RRR without MRG, good peripheral pulses, no edema  Respiratory: CTA biL, no wheezing, no crackles  Abdomen: mildly distended, non tender  MSK/Extremities: no clubbing/cyanosis  Skin: no rashes  Neuro: non focal   Data Reviewed: Basic Metabolic Panel:  Recent Labs Lab 03/08/15 0039 03/09/15 0615  NA 137 140  K 3.7 3.8  CL 99 108  CO2 27 28  GLUCOSE 215* 161*  BUN 12 11  CREATININE 0.69 0.84  CALCIUM 9.5 8.2*    Recent Labs Lab 03/08/15 0039  LIPASE 20   CBC:  Recent Labs Lab 03/08/15 0039 03/09/15 0615  WBC 17.5* 6.7  NEUTROABS 15.8*  --   HGB 14.8 11.3*  HCT 44.4 35.6*  MCV 82.8 85.4  PLT 410* 281   Cardiac Enzymes:  Recent Labs Lab 03/08/15 0039  TROPONINI <0.03   CBG:  Recent Labs Lab 03/08/15 1641 03/08/15 2022 03/09/15 0023 03/09/15 0338 03/09/15 0745  GLUCAP 122* 116* 96 94 128*    Recent Results (from the past 240 hour(s))  Clostridium Difficile by PCR     Status: None   Collection Time: 03/09/15  9:04 AM  Result Value Ref Range Status   C difficile by pcr NEGATIVE NEGATIVE Final     Scheduled Meds: . antiseptic oral rinse  7 mL Mouth Rinse q12n4p  . chlorhexidine  15 mL Mouth Rinse BID  . ciprofloxacin  400 mg Intravenous Q12H  . enoxaparin (LOVENOX) injection  40 mg Subcutaneous Q24H  . insulin aspart  0-15 Units Subcutaneous 6 times per day  . lidocaine  15 mL Mouth/Throat Once  . metronidazole  500 mg  Intravenous 3 times per day   Continuous Infusions: . sodium chloride Stopped (03/09/15 1207)  . sodium chloride 0.9 % 1,000 mL with potassium chloride 20 mEq infusion 125 mL/hr at 03/09/15 1207   Time spent: 35 minutes   Pamella Pert, MD [facility] Pager [phone]. If 7 PM - 7 AM, please contact night-coverage at [email or web address], password [facility] 03/09/2015, 1:18 PM  LOS: 1 day

## 2015-03-09 NOTE — Consult Note (Signed)
[facility] Consult: 12:11 PM 03/09/2015  LOS: 1 day    Referring Provider: Dr Elvera Lennox  Primary Care Physician:  Jackie Plum, MD Primary Gastroenterologist:  Gentry Fitz.  Seen by GI in [place] at [place]: Dr Karie Schwalbe. Bing Plume.    Spoke with pt via phone interpeter  Reason for Consultation:  SBO, ? Crohn's.    HPI: Carrie Larson is a 54 y.o. female.  Born in [place] does not speak english, understands a little bit. PMH Type 2 DM on Metformin at home. Nasal polyps.  S/p hysterectomy, cholecystectomy, C section. Has and umbilical hernia.  Colonoscopy in 2015 in  [facility].  02/2014 Colonoscopy For screening and for hx of colitis "20 years ago" and colonoscopy in [place] then.  Findings: normal TI.  Grossly normal appearing colon.  Random biopsies showed no evidence of colitis.  Int/ext hemorrhoids noted.   For about 2 months has been having abdominal pain, no specific location and no associated diarrhea, has ~ 2 formed BMs per day.  For the pain she switched herself to a soft diet, avoided fats and used bismuth and a root called "dutia" "malaga" she purchased from [employer] (nothing comes up on google search for these). In last 21 months has had 23# weight loss associated with reduced appetite.   On Sunday 3/14 she ate a lot more than usual, including bananas in celebrating her mother's birthday.  The next day she had acute abdominal pain and n/v of non-bloody material, BMs were scant to none.  Admitted from [facility] ED 3/15  WBCs 17.5.  Started on Cipro/Flagyl IV. Chemistries unremarkable except for elevated glucose.  Xray shows SBO.  Follow up today also with stable SBO.   CT scan shows obstruction at area of narrowing in area of mid/distal ileum, associated changes in intestinal wall and  adjacent soft tissue worrisome for underlying inflammatory process.    C diff is negative.  Stool pathogen panel in process.  Surgery has seen pt, favors conservative mgt.  Raise ? Of IBD.   NGT placed but she pulled it out.  Refused to have it replaced. She is feeling better.  Pain decreased.   Last emesis was yesterday AM, 3/15.  No nausea.  Passing lots of flatus today. Asking if she can have warm milk.    Maternal grandmother and her mom had stomach, intestinal issues but no formal diagnosis of IBD.  Her sister's son has been diagnosed with Crohn's.  Cares for a 54 y/o lady who is stool incontinent (not clear if diarrheal illness) but recently underwent intestinal surgery.       Past Medical History  Diagnosis Date  . Hypertension   . Diabetes mellitus without complication   . Environmental and seasonal allergies     cats & dogs also  . Small bowel obstruction 02/2015  . Complication of anesthesia     " a little anesthesia has a big affect "  . Nasal polyps     Past Surgical History  Procedure Laterality Date  . Cholecystectomy    .  Abdominal hysterectomy    . Debridement of abdominal wall abscess    . Cesarean section    . Galactocele      left breast    Prior to Admission medications   Medication Sig Start Date End Date Taking? Authorizing Provider  fluticasone (FLONASE) 50 MCG/ACT nasal spray Place 2 sprays into both nostrils daily as needed for allergies or rhinitis.   Yes Historical Provider, MD  HYDROcodone-ibuprofen (VICOPROFEN) 7.5-200 MG per tablet Take 1 tablet by mouth every 8 (eight) hours as needed for moderate pain.   Yes Historical Provider, MD  lisinopril (PRINIVIL,ZESTRIL) 20 MG tablet Take 20 mg by mouth daily.   Yes Historical Provider, MD  metFORMIN (GLUCOPHAGE) 500 MG tablet Take 500 mg by mouth 2 (two) times daily with a meal.   Yes Historical Provider, MD    Scheduled Meds: . antiseptic oral rinse  7 mL Mouth Rinse q12n4p  . chlorhexidine  15 mL  Mouth Rinse BID  . ciprofloxacin  400 mg Intravenous Q12H  . enoxaparin (LOVENOX) injection  40 mg Subcutaneous Q24H  . insulin aspart  0-15 Units Subcutaneous 6 times per day  . lidocaine  15 mL Mouth/Throat Once  . metronidazole  500 mg Intravenous 3 times per day   Infusions: . sodium chloride Stopped (03/09/15 1207)  . sodium chloride 0.9 % 1,000 mL with potassium chloride 20 mEq infusion 125 mL/hr at 03/09/15 1207   PRN Meds: acetaminophen **OR** acetaminophen, benzocaine, hydrALAZINE, morphine injection, ondansetron **OR** ondansetron (ZOFRAN) IV   Allergies as of 03/07/2015  . (No Known Allergies)    History reviewed. No pertinent family history.  History   Social History  . Marital Status: Married    Spouse Name: N/A  . Number of Children: N/A  . Years of Education: N/A   Occupational History  . Not on file.   Social History Main Topics  . Smoking status: Never Smoker   . Smokeless tobacco: Never Used  . Alcohol Use: No  . Drug Use: No  . Sexual Activity: Not on file   Other Topics Concern  . Not on file   Social History Narrative    REVIEW OF SYSTEMS: Constitutional:  23# weight loss in 21 months. .   ENT:  No nose bleeds Pulm:  No cough or SOB CV:  No palpitations, no LE edema.  GU:  No hematuria, no frequency GI:  Per HPI.  No dysphagia, no heartburn Heme:  No unusual bleeding or bruising   Transfusions:  None ever.  Neuro:  No headaches, no peripheral tingling or numbness Derm:  No itching, no rash or sores.  Endocrine:  No sweats or chills.  No polyuria or dysuria Immunization:  Flu shot fall 2015.  Travel:  None beyond local counties in last few months.    PHYSICAL EXAM: Vital signs in last 24 hours: Filed Vitals:   03/09/15 0513  BP: 107/48  Pulse: 68  Temp: 98.6 F (37 C)  Resp: 17   Wt Readings from Last 3 Encounters:  03/07/15 177 lb (80.287 kg)    General: pleasant, comfortable, healthy Head:  No asymmetry or facial edema    Eyes:  No icterus or pallor.  EOMI Ears:  Good hearing  Nose:  No congestion or discharge Mouth:  A lot of missing teeth, some caries.  Moist, pink and clear oral MM Neck:  No JVD, no TMG, no mass Lungs:  Clear bil.  No cough or trouble breathing Heart: RRR.  No  MRG Abdomen:  Soft, ND.  No hernias.  Old scars c/w hysterectomy and open chole. No HSM or mass.  Active BS, no tinkling or tympanitic BS.  Rectal: deferred   Musc/Skeltl: no joint erythema, swelling, deformity or contractures Extremities:  No CCE  Neurologic:  Oriented x 3.  No tremor, no limb weakness.   Skin:  No telangectasia, rash or sores Tattoos:  none Nodes:  No cervical or inguinal adenopathy   Psych:  Pleasant, relaxed, cooperative.   Intake/Output from previous day: 03/15 0701 - 03/16 0700 In: 1755 [I.V.:1755] Out: 2 [Emesis/NG output:2] Intake/Output this shift:    LAB RESULTS:  Recent Labs  03/08/15 0039 03/09/15 0615  WBC 17.5* 6.7  HGB 14.8 11.3*  HCT 44.4 35.6*  PLT 410* 281   BMET Lab Results  Component Value Date   NA 140 03/09/2015   NA 137 03/08/2015   K 3.8 03/09/2015   K 3.7 03/08/2015   CL 108 03/09/2015   CL 99 03/08/2015   CO2 28 03/09/2015   CO2 27 03/08/2015   GLUCOSE 161* 03/09/2015   GLUCOSE 215* 03/08/2015   BUN 11 03/09/2015   BUN 12 03/08/2015   CREATININE 0.84 03/09/2015   CREATININE 0.69 03/08/2015   CALCIUM 8.2* 03/09/2015   CALCIUM 9.5 03/08/2015   LFT No results for input(s): PROT, ALBUMIN, AST, ALT, ALKPHOS, BILITOT, BILIDIR, IBILI in the last 72 hours. PT/INR No results found for: INR, PROTIME Hepatitis Panel No results for input(s): HEPBSAG, HCVAB, HEPAIGM, HEPBIGM in the last 72 hours. C-Diff No components found for: CDIFF Lipase     Component Value Date/Time   LIPASE 20 03/08/2015 0039    Drugs of Abuse  No results found for: LABOPIA, COCAINSCRNUR, LABBENZ, AMPHETMU, THCU, LABBARB   RADIOLOGY STUDIES: Ct Abdomen Pelvis W Contrast 03/08/2015      FINDINGS: Minimal bibasilar atelectasis is noted.  The liver and spleen are unremarkable in appearance. The patient is status post cholecystectomy. The pancreas and adrenal glands are unremarkable.  The kidneys are unremarkable in appearance. There is no evidence of hydronephrosis. No renal or ureteral stones are seen. No perinephric stranding is appreciated.  There is diffuse dilatation of small bowel loops throughout the abdomen and upper pelvis, measuring up to 4.4 cm in maximal diameter. This appears to reflect focal narrowing of the small bowel just distal to the dilated mid to distal ileum, with mildly increased wall enhancement and surrounding soft tissue inflammation.  Several additional segments of increased wall enhancement and soft tissue inflammation are seen along the mid and distal ileum, both proximal and distal to the site of obstruction. This raises suspicion for an underlying inflammatory small bowel process. Acute infectious ileitis or malignancy are considered less likely. There is no definite evidence for adhesion.  Trace free fluid is seen at the right mid abdomen, and tracking adjacent to small bowel loops. A small amount of free fluid is noted tracking along the left paracolic gutter, into the pelvis.  The stomach is within normal limits. No acute vascular abnormalities are seen.  The appendix is normal in caliber, without evidence for appendicitis. The colon is largely decompressed and grossly unremarkable in appearance.  The bladder is decompressed and not well assessed. The patient is status post hysterectomy. The ovaries are relatively symmetric. No suspicious adnexal masses are seen. No inguinal lymphadenopathy is seen.  No acute osseous abnormalities are identified.  IMPRESSION: 1. Diffuse dilatation of small bowel loops throughout the abdomen and upper pelvis, compatible with  small bowel obstruction. The obstruction appears to reflect focal narrowing of the small bowel just distal to  the dilated mid to distal ileum, with mildly increased wall enhancement and surrounding soft tissue inflammation. Several additional segments of focal narrowing, wall enhancement and soft tissue inflammation are seen along the mid and distal ileum, both proximal and distal to the site of obstruction. This raises suspicion for underlying inflammatory small bowel process. Acute infectious ileitis or malignancy are considered less likely. 2. Trace associated free fluid noted tracking about small bowel loops and extending into the pelvis.  These results were called by telephone at the time of interpretation on 03/08/2015 at 2:24 am to Dr. Cy Blamer, who verbally acknowledged these results.   Electronically Signed   By: Roanna Raider M.D.   On: 03/08/2015 02:24   Dg Abd 2 Views 03/09/2015   CLINICAL DATA:  Subsequent evaluation of abdominal discomfort since yesterday with diagnosis of small bowel obstruction  EXAM: ABDOMEN - 2 VIEW  COMPARISON:  03/08/2015  FINDINGS: Dilated loops of bowel in the left abdomen measuring up to 4 cm and demonstrating air-fluid levels. Other nondistended loops of small bowel in the mid abdomen are identified which also demonstrate air-fluid levels. The colon is decompressed with minimal gas. There is no free air. The degree of distention is not significantly different when compared to the prior study.  IMPRESSION: Small bowel obstruction with similar features as compared to prior radiograph.   Electronically Signed   By: Esperanza Heir M.D.   On: 03/09/2015 08:17   Dg Abd Acute W/chest 03/08/2015   FINDINGS: The lungs are well-aerated and clear. There is no evidence of focal opacification, pleural effusion or pneumothorax. The cardiomediastinal silhouette is within normal limits.  There is dilatation of small bowel loops to 4.1 cm in maximal diameter at the mid abdomen, raising concern for some degree of bowel obstruction. Associated air-fluid levels are seen. A small amount of  stool is still seen within the colon. No free intra-abdominal air is identified on the provided upright view.  No acute osseous abnormalities are seen; the sacroiliac joints are unremarkable in appearance.  IMPRESSION: 1. Dilatation of a small bowel loops to 4.1 cm, with associated air-fluid levels, at the mid abdomen. This raises concern for some degree of bowel obstruction. CT of the abdomen and pelvis would be helpful for further evaluation. No free intra-abdominal air seen. 2. No acute cardiopulmonary process identified.  These results were called by telephone at the time of interpretation on 03/08/2015 at 12:15 am to Dr. Cy Blamer, who verbally acknowledged these results.   Electronically Signed   By: Roanna Raider M.D.   On: 03/08/2015 00:16    ENDOSCOPIC STUDIES: Colonoscopy in [facility].   IMPRESSION:   *  SBO.   Ileitis on CT raises ? Of IBD (Crohn's).  ? Infectious?.  Day 2 empiric cipro/flagyl.  Pt reported hx of colitis > 20 yrs ago on colonoscopy in [place].  No gross or microscopic evidence of colitis on 02/2014 colonoscopy with biopsies, no changes in TI then.  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  WBCs much improved, clinically improved but xray earlier this AM without significant improvement.   *  DM.  On oral agents at home.   *  Weight loss of 23# in last 21 months, in setting of decreased po intake during this time.   PLAN:     *  Per Dr Marina Goodell.   Jennye Moccasin  03/09/2015, 12:11 PM Pager: [phone]  GI ATTENDING  History, laboratories, x-rays reviewed. Patient seen and examined. Agree with H&P as outlined above. The patient presents with small bowel obstruction. There is some question as to whether she has a background of inflammatory bowel disease. If she does have a history of Crohn's disease, this may be the cause of bowel obstruction. Otherwise, she could have adhesions causing obstruction with  secondary bowel wall edema. In terms of GI evaluation, the patient could not prep for colonoscopy in the presence of a bowel obstruction. Furthermore, the area of interest is seemingly beyond the reach of the colonoscope. In any event, continue to manage small bowel obstruction as you would normally. Agree with obtaining outside records for review. If she does not resolve small bowel obstruction, regardless of the cause, she will need small bowel resection. If she does resolve, then further imaging at the appropriate time can be pursued. We'll follow with you  Wilhemina Bonito. Eda Keys., M.D. [facility] Division of Gastroenterology

## 2015-03-09 NOTE — Progress Notes (Signed)
Patient ID: Carrie Larson, female   DOB: 11/23/1960, 54 y.o.   MRN: [id number]    Subjective: Pt feels much better today.  No further nausea.  Had diarrhea yesterday, but no further BMs today.  Just lots of flatus.  Feels like what little pain she has is "gas pains"  Objective: Vital signs in last 24 hours: Temp:  [98 F (36.7 C)-98.6 F (37 C)] 98.6 F (37 C) (03/16 0513) Pulse Rate:  [67-90] 68 (03/16 0513) Resp:  [17-20] 17 (03/16 0513) BP: (101-128)/(45-76) 107/48 mmHg (03/16 0513) SpO2:  [99 %-100 %] 99 % (03/16 0513) Last BM Date: 03/08/15  Intake/Output from previous day: 03/15 0701 - 03/16 0700 In: 1755 [I.V.:1755] Out: 2 [Emesis/NG output:2] Intake/Output this shift:    PE: Abd: soft, ND, +BS, NT Heart: regular Lungs: CTAB  Lab Results:   Recent Labs  03/08/15 0039 03/09/15 0615  WBC 17.5* 6.7  HGB 14.8 11.3*  HCT 44.4 35.6*  PLT 410* 281   BMET  Recent Labs  03/08/15 0039 03/09/15 0615  NA 137 140  K 3.7 3.8  CL 99 108  CO2 27 28  GLUCOSE 215* 161*  BUN 12 11  CREATININE 0.69 0.84  CALCIUM 9.5 8.2*   PT/INR No results for input(s): LABPROT, INR in the last 72 hours. CMP     Component Value Date/Time   NA 140 03/09/2015 0615   K 3.8 03/09/2015 0615   CL 108 03/09/2015 0615   CO2 28 03/09/2015 0615   GLUCOSE 161* 03/09/2015 0615   BUN 11 03/09/2015 0615   CREATININE 0.84 03/09/2015 0615   CALCIUM 8.2* 03/09/2015 0615   GFRNONAA 78* 03/09/2015 0615   GFRAA >90 03/09/2015 0615   Lipase     Component Value Date/Time   LIPASE 20 03/08/2015 0039       Studies/Results: Ct Abdomen Pelvis W Contrast  03/08/2015   CLINICAL DATA:  Acute onset of mid abdominal pain, nausea and vomiting. Initial encounter.  EXAM: CT ABDOMEN AND PELVIS WITH CONTRAST  TECHNIQUE: Multidetector CT imaging of the abdomen and pelvis was performed using the standard protocol following bolus administration of intravenous contrast.  CONTRAST:  25mL OMNIPAQUE IOHEXOL  300 MG/ML SOLN, OMNIPAQUE IOHEXOL 300 MG/ML SOLN  COMPARISON:  Abdominal radiograph performed earlier today at 12:15 a.m.  FINDINGS: Minimal bibasilar atelectasis is noted.  The liver and spleen are unremarkable in appearance. The patient is status post cholecystectomy. The pancreas and adrenal glands are unremarkable.  The kidneys are unremarkable in appearance. There is no evidence of hydronephrosis. No renal or ureteral stones are seen. No perinephric stranding is appreciated.  There is diffuse dilatation of small bowel loops throughout the abdomen and upper pelvis, measuring up to 4.4 cm in maximal diameter. This appears to reflect focal narrowing of the small bowel just distal to the dilated mid to distal ileum, with mildly increased wall enhancement and surrounding soft tissue inflammation.  Several additional segments of increased wall enhancement and soft tissue inflammation are seen along the mid and distal ileum, both proximal and distal to the site of obstruction. This raises suspicion for an underlying inflammatory small bowel process. Acute infectious ileitis or malignancy are considered less likely. There is no definite evidence for adhesion.  Trace free fluid is seen at the right mid abdomen, and tracking adjacent to small bowel loops. A small amount of free fluid is noted tracking along the left paracolic gutter, into the pelvis.  The stomach is within normal limits.  No acute vascular abnormalities are seen.  The appendix is normal in caliber, without evidence for appendicitis. The colon is largely decompressed and grossly unremarkable in appearance.  The bladder is decompressed and not well assessed. The patient is status post hysterectomy. The ovaries are relatively symmetric. No suspicious adnexal masses are seen. No inguinal lymphadenopathy is seen.  No acute osseous abnormalities are identified.  IMPRESSION: 1. Diffuse dilatation of small bowel loops throughout the abdomen and upper  pelvis, compatible with small bowel obstruction. The obstruction appears to reflect focal narrowing of the small bowel just distal to the dilated mid to distal ileum, with mildly increased wall enhancement and surrounding soft tissue inflammation. Several additional segments of focal narrowing, wall enhancement and soft tissue inflammation are seen along the mid and distal ileum, both proximal and distal to the site of obstruction. This raises suspicion for underlying inflammatory small bowel process. Acute infectious ileitis or malignancy are considered less likely. 2. Trace associated free fluid noted tracking about small bowel loops and extending into the pelvis.  These results were called by telephone at the time of interpretation on 03/08/2015 at 2:24 am to Dr. Cy Blamer, who verbally acknowledged these results.   Electronically Signed   By: Roanna Raider M.D.   On: 03/08/2015 02:24   Dg Abd 2 Views  03/09/2015   CLINICAL DATA:  Subsequent evaluation of abdominal discomfort since yesterday with diagnosis of small bowel obstruction  EXAM: ABDOMEN - 2 VIEW  COMPARISON:  03/08/2015  FINDINGS: Dilated loops of bowel in the left abdomen measuring up to 4 cm and demonstrating air-fluid levels. Other nondistended loops of small bowel in the mid abdomen are identified which also demonstrate air-fluid levels. The colon is decompressed with minimal gas. There is no free air. The degree of distention is not significantly different when compared to the prior study.  IMPRESSION: Small bowel obstruction with similar features as compared to prior radiograph.   Electronically Signed   By: Esperanza Heir M.D.   On: 03/09/2015 08:17   Dg Abd Acute W/chest  03/08/2015   CLINICAL DATA:  Acute onset of central abdominal pain, with nausea and vomiting. Initial encounter.  EXAM: ACUTE ABDOMEN SERIES (ABDOMEN 2 VIEW & CHEST 1 VIEW)  COMPARISON:  None.  FINDINGS: The lungs are well-aerated and clear. There is no evidence of  focal opacification, pleural effusion or pneumothorax. The cardiomediastinal silhouette is within normal limits.  There is dilatation of small bowel loops to 4.1 cm in maximal diameter at the mid abdomen, raising concern for some degree of bowel obstruction. Associated air-fluid levels are seen. A small amount of stool is still seen within the colon. No free intra-abdominal air is identified on the provided upright view.  No acute osseous abnormalities are seen; the sacroiliac joints are unremarkable in appearance.  IMPRESSION: 1. Dilatation of a small bowel loops to 4.1 cm, with associated air-fluid levels, at the mid abdomen. This raises concern for some degree of bowel obstruction. CT of the abdomen and pelvis would be helpful for further evaluation. No free intra-abdominal air seen. 2. No acute cardiopulmonary process identified.  These results were called by telephone at the time of interpretation on 03/08/2015 at 12:15 am to Dr. Cy Blamer, who verbally acknowledged these results.   Electronically Signed   By: Roanna Raider M.D.   On: 03/08/2015 00:16    Anti-infectives: Anti-infectives    Start     Dose/Rate Route Frequency Ordered Stop   03/08/15 1800  ciprofloxacin (CIPRO) IVPB 400 mg     400 mg 200 mL/hr over 60 Minutes Intravenous Every 12 hours 03/08/15 1005 03/13/15 1759   03/08/15 1400  metroNIDAZOLE (FLAGYL) IVPB 500 mg     500 mg 100 mL/hr over 60 Minutes Intravenous 3 times per day 03/08/15 1005     03/08/15 0230  ciprofloxacin (CIPRO) IVPB 400 mg     400 mg 200 mL/hr over 60 Minutes Intravenous  Once 03/08/15 0222 03/08/15 0758   03/08/15 0230  metroNIDAZOLE (FLAGYL) IVPB 500 mg     500 mg 100 mL/hr over 60 Minutes Intravenous  Once 03/08/15 0222 03/08/15 0611       Assessment/Plan   1. Partial small bowel obstruction secondary to small bowel thickening -? IBD, infection ileitis, vs malignancy -still waiting to obtain medical records from [facility] to determine what her  colonoscopy showed a year ago and what her biopsies revealed. It sounds like she had her colon biopsied and not her small bowel. I am suspicious that the patient has something more going on than a partial obstruction secondary to adhesive disease. She likely would benefit from GI consultation as well for further recommendations for work up.  - abdominal films today still show partial obstruction, despite the patient feeling much better.  Will still keep NPO but give ice chips and sips -WBC normalized today -will follow   LOS: 1 day    Mykale Gandolfo E 03/09/2015, 8:57 AM Pager: [phone]

## 2015-03-10 DIAGNOSIS — E119 Type 2 diabetes mellitus without complications: Secondary | ICD-10-CM | POA: Insufficient documentation

## 2015-03-10 LAB — BASIC METABOLIC PANEL
Anion gap: 10 (ref 5–15)
BUN: 5 mg/dL — ABNORMAL LOW (ref 6–23)
CALCIUM: 8.1 mg/dL — AB (ref 8.4–10.5)
CHLORIDE: 107 mmol/L (ref 96–112)
CO2: 21 mmol/L (ref 19–32)
Creatinine, Ser: 0.71 mg/dL (ref 0.50–1.10)
GFR calc Af Amer: >90 mL/min (ref >90)
GFR calc non Af Amer: >90 mL/min (ref >90)
GLUCOSE: 85 mg/dL (ref 70–99)
POTASSIUM: 3.8 mmol/L (ref 3.5–5.1)
SODIUM: 138 mmol/L (ref 135–145)

## 2015-03-10 LAB — CBC
HCT: 31.9 % — ABNORMAL LOW (ref 36.0–46.0)
HEMOGLOBIN: 10.3 g/dL — AB (ref 12.0–15.0)
MCH: 27.8 pg (ref 26.0–34.0)
MCHC: 32.3 g/dL (ref 30.0–36.0)
MCV: 86 fL (ref 78.0–100.0)
PLATELETS: 247 10*3/uL (ref 150–400)
RBC: 3.71 MIL/uL — AB (ref 3.87–5.11)
RDW: 14.3 % (ref 11.5–15.5)
WBC: 4.5 10*3/uL (ref 4.0–10.5)

## 2015-03-10 LAB — GLUCOSE, CAPILLARY
GLUCOSE-CAPILLARY: 76 mg/dL (ref 70–99)
GLUCOSE-CAPILLARY: 86 mg/dL (ref 70–99)
Glucose-Capillary: 80 mg/dL (ref 70–99)
Glucose-Capillary: 86 mg/dL (ref 70–99)
Glucose-Capillary: 84 mg/dL (ref 70–99)

## 2015-03-10 MED ORDER — INSULIN ASPART 100 UNIT/ML ~~LOC~~ SOLN: 0.0000 [IU] | Freq: Every day | SUBCUTANEOUS | Status: DC

## 2015-03-10 MED ORDER — INSULIN ASPART 100 UNIT/ML ~~LOC~~ SOLN: 0.0000 [IU] | Freq: Three times a day (TID) | SUBCUTANEOUS | Status: DC

## 2015-03-10 NOTE — Progress Notes (Signed)
Daily Rounding Note  03/10/2015, 9:54 AM  LOS: 2 days   SUBJECTIVE:       2 formed stools yesterday, passing flatus, no n/v.  Hungry.    OBJECTIVE:         Vital signs in last 24 hours:    Temp:  [98.2 F (36.8 C)-98.8 F (37.1 C)] 98.2 F (36.8 C) (03/17 0507) Pulse Rate:  [64-72] 64 (03/17 0507) Resp:  [17-18] 17 (03/17 0507) BP: (99-105)/(56-60) 105/56 mmHg (03/17 0507) SpO2:  [100 %] 100 % (03/17 0507) Last BM Date: 03/08/15 Filed Weights   03/07/15 2127  Weight: 177 lb (80.287 kg)   General: looks well.  In good spirits.   Heart: RRR. Chest: clear bil.  No cough or dyspnea Abdomen: soft, BS active, ND, NT   Extremities: no CCE Neuro/Psych:  Pleasant, relaxed.  No obvious deficits.   Intake/Output from previous day: 03-22-23 0701 - 03/17 0700 In: 2815.8 [P.O.:20; I.V.:2195.8; IV Piggyback:600] Out: -   Intake/Output this shift:    Lab Results:  Recent Labs  03/08/15 0039 2015/03/22 0615 03/10/15 0607  WBC 17.5* 6.7 4.5  HGB 14.8 11.3* 10.3*  HCT 44.4 35.6* 31.9*  PLT 410* 281 247   BMET  Recent Labs  03/08/15 0039 03-22-2015 0615 03/10/15 0607  NA 137 140 138  K 3.7 3.8 3.8  CL 99 108 107  CO2 GLUCOSE 215* 161* 85  BUN 12 11 5*  CREATININE 0.69 0.84 0.71  CALCIUM 9.5 8.2* 8.1*    Studies/Results: Dg Abd 2 Views  March 22, 2015   CLINICAL DATA:  Subsequent evaluation of abdominal discomfort since yesterday with diagnosis of small bowel obstruction  EXAM: ABDOMEN - 2 VIEW  COMPARISON:  03/08/2015  FINDINGS: Dilated loops of bowel in the left abdomen measuring up to 4 cm and demonstrating air-fluid levels. Other nondistended loops of small bowel in the mid abdomen are identified which also demonstrate air-fluid levels. The colon is decompressed with minimal gas. There is no free air. The degree of distention is not significantly different when compared to the prior study.  IMPRESSION:  Small bowel obstruction with similar features as compared to prior radiograph.   Electronically Signed   By: Esperanza Heir M.D.   On: March 22, 2015 08:17   Scheduled Meds: . antiseptic oral rinse  7 mL Mouth Rinse q12n4p  . chlorhexidine  15 mL Mouth Rinse BID  . ciprofloxacin  400 mg Intravenous Q12H  . enoxaparin (LOVENOX) injection  40 mg Subcutaneous Q24H  . insulin aspart  0-15 Units Subcutaneous 6 times per day  . lidocaine  15 mL Mouth/Throat Once  . metronidazole  500 mg Intravenous 3 times per day   Continuous Infusions: . sodium chloride Stopped (2015/03/22 1207)  . sodium chloride 0.9 % 1,000 mL with potassium chloride 20 mEq infusion 125 mL/hr at 03/10/15 0703   PRN Meds:.acetaminophen **OR** acetaminophen, benzocaine, hydrALAZINE, morphine injection, ondansetron **OR** ondansetron (ZOFRAN) IV  ASSESMENT:   * SBO.  Ileitis on CT raises ? Of IBD (Crohn's). ? Infectious?. Day 3 empiric cipro/flagyl.  Pt reported hx of colitis > 20 yrs ago on colonoscopy in [place]. No gross or microscopic evidence of colitis on 02/2014 colonoscopy with biopsies, no changes in TI then. Leukocytosis resolved.  KUB not repeated but obstruction clinically resolved.   * DM. On oral agents at home.     PLAN   *  Beginning clears as suggested by  surgery.   *  ? Timing of repeat CT imaging, versus post convalescence SBFT, to assess for signs of IBD .   *  Will stop abx.      Jennye Moccasin  03/10/2015, 9:54 AM Pager: [phone]  GI ATTENDING  Interval history data reviewed. Patient seen and examined. Moving bowels. Abdominal exam benign. Small bowel obstruction has resolved. Agree with advancing diet as tolerated. The patient should follow-up with her primary gastroenterologist, Dr. Norma Fredrickson in 1-2 weeks. He can decide on follow-up imaging to ascertain for resolution of small bowel changes on CT. Will sign off.  Wilhemina Bonito. Eda Keys., M.D. [facility] Division of Gastroenterology

## 2015-03-10 NOTE — Progress Notes (Signed)
Patient ID: Carrie Larson, female   DOB: [birth year], 54 y.o.   MRN: [id number]     [address] [place], [place] [address]    Phone: [phone] FAX: [phone]     Subjective: Spoke via Carrie Larson much better today.  No pain, but some bloating.  Had several formed BMs.  Denies any nausea or vomiting.  She is hungry.  Pt remains afebrile.  Normal white count.   Objective:  Vital signs:  Filed Vitals:   03/09/15 0513 03/09/15 1427 03/09/15 2052 03/10/15 0507  BP: 107/48 99/57 105/60 105/56  Pulse: 68 72 70 64  Temp: 98.6 F (37 C) 98.3 F (36.8 C) 98.8 F (37.1 C) 98.2 F (36.8 C)  TempSrc: Oral Oral Oral Oral  Resp: _0 Height:      Weight:      SpO2: 99% 100% 100% 100%    Last BM Date: 03/08/15  Intake/Output   Yesterday:  03/16 0701 - 03/17 0700 In: 2815.8 [P.O.:20; I.V.:2195.8; IV Piggyback:600] Out: -  This shift:    I/O last 3 completed shifts: In: 4015.8 [P.O.:20; I.V.:3395.8; IV ZMOQHUTML:465] Out: -    Physical Exam: General: Pt awake/alert/oriented x4 in no acute distress  Abdomen: Soft.  Nondistended.  Non tender. No evidence of peritonitis.  No incarcerated hernias.    Problem List:   Principal Problem:   SBO (small bowel obstruction) Active Problems:   Small bowel obstruction   Type 2 diabetes mellitus   Essential hypertension   UTI (urinary tract infection)    Results:   Labs: Results for orders placed or performed during the hospital encounter of 03/07/15 (from the past 48 hour(s))  Glucose, capillary     Status: Abnormal   Collection Time: 03/08/15  9:55 AM  Result Value Ref Range   Glucose-Capillary 179 (H) 70 - 99 mg/dL  Glucose, capillary     Status: Abnormal   Collection Time: 03/08/15 12:14 PM  Result Value Ref Range   Glucose-Capillary 130 (H) 70 - 99 mg/dL  Glucose, capillary     Status: Abnormal   Collection Time: 03/08/15  4:41  PM  Result Value Ref Range   Glucose-Capillary 122 (H) 70 - 99 mg/dL  Culture, Urine     Status: None   Collection Time: 03/08/15  4:44 PM  Result Value Ref Range   Specimen Description URINE, CLEAN CATCH    Special Requests Normal    Colony Count NO GROWTH Performed at [facility]     Culture NO GROWTH Performed at [facility]     Report Status 03/09/2015 FINAL   Glucose, capillary     Status: Abnormal   Collection Time: 03/08/15  8:22 PM  Result Value Ref Range   Glucose-Capillary 116 (H) 70 - 99 mg/dL  Glucose, capillary     Status: None   Collection Time: 03/09/15 12:23 AM  Result Value Ref Range   Glucose-Capillary 96 70 - 99 mg/dL  Glucose, capillary     Status: None   Collection Time: 03/09/15  3:38 AM  Result Value Ref Range   Glucose-Capillary 94 70 - 99 mg/dL  CBC     Status: Abnormal   Collection Time: 03/09/15  6:15 AM  Result Value Ref Range   WBC 6.7 4.0 - 10.5 K/uL   RBC 4.17 3.87 - 5.11 MIL/uL   Hemoglobin 11.3 (L) 12.0 - 15.0 g/dL  HCT 35.6 (L) 36.0 - 46.0 %   MCV 85.4 78.0 - 100.0 fL   MCH 27.1 26.0 - 34.0 pg   MCHC 31.7 30.0 - 36.0 g/dL   RDW 14.7 11.5 - 15.5 %   Platelets 281 150 - 400 K/uL  Basic metabolic panel     Status: Abnormal   Collection Time: 03/09/15  6:15 AM  Result Value Ref Range   Sodium 140 135 - 145 mmol/L   Potassium 3.8 3.5 - 5.1 mmol/L   Chloride 108 96 - 112 mmol/L   CO2 28 19 - 32 mmol/L   Glucose, Bld 161 (H) 70 - 99 mg/dL   BUN 11 6 - 23 mg/dL   Creatinine, Ser 0.84 0.50 - 1.10 mg/dL   Calcium 8.2 (L) 8.4 - 10.5 mg/dL   GFR calc non Af Amer 78 (L) >90 mL/min   GFR calc Af Amer >90 >90 mL/min    Comment: (NOTE) The eGFR has been calculated using the CKD EPI equation. This calculation has not been validated in all clinical situations. eGFR's persistently <90 mL/min signify possible Chronic Kidney Disease.    Anion gap 4 (L) 5 - 15  Glucose, capillary     Status: Abnormal   Collection Time:  03/09/15  7:45 AM  Result Value Ref Range   Glucose-Capillary 128 (H) 70 - 99 mg/dL  Clostridium Difficile by PCR     Status: None   Collection Time: 03/09/15  9:04 AM  Result Value Ref Range   C difficile by pcr NEGATIVE NEGATIVE  Glucose, capillary     Status: None   Collection Time: 03/09/15 12:18 PM  Result Value Ref Range   Glucose-Capillary 82 70 - 99 mg/dL  Glucose, capillary     Status: None   Collection Time: 03/09/15  5:07 PM  Result Value Ref Range   Glucose-Capillary 86 70 - 99 mg/dL  Glucose, capillary     Status: None   Collection Time: 03/09/15  8:06 PM  Result Value Ref Range   Glucose-Capillary 93 70 - 99 mg/dL  Glucose, capillary     Status: None   Collection Time: 03/09/15 11:08 PM  Result Value Ref Range   Glucose-Capillary 79 70 - 99 mg/dL  Glucose, capillary     Status: None   Collection Time: 03/10/15  3:03 AM  Result Value Ref Range   Glucose-Capillary 80 70 - 99 mg/dL  CBC     Status: Abnormal   Collection Time: 03/10/15  6:07 AM  Result Value Ref Range   WBC 4.5 4.0 - 10.5 K/uL   RBC 3.71 (L) 3.87 - 5.11 MIL/uL   Hemoglobin 10.3 (L) 12.0 - 15.0 g/dL   HCT 31.9 (L) 36.0 - 46.0 %   MCV 86.0 78.0 - 100.0 fL   MCH 27.8 26.0 - 34.0 pg   MCHC 32.3 30.0 - 36.0 g/dL   RDW 14.3 11.5 - 15.5 %   Platelets 247 150 - 400 K/uL  Basic metabolic panel     Status: Abnormal   Collection Time: 03/10/15  6:07 AM  Result Value Ref Range   Sodium 138 135 - 145 mmol/L   Potassium 3.8 3.5 - 5.1 mmol/L   Chloride 107 96 - 112 mmol/L   CO2 21 19 - 32 mmol/L   Glucose, Bld 85 70 - 99 mg/dL   BUN 5 (L) 6 - 23 mg/dL   Creatinine, Ser 0.71 0.50 - 1.10 mg/dL   Calcium 8.1 (L) 8.4 - 10.5   mg/dL   GFR calc non Af Amer >90 >90 mL/min   GFR calc Af Amer >90 >90 mL/min    Comment: (NOTE) The eGFR has been calculated using the CKD EPI equation. This calculation has not been validated in all clinical situations. eGFR's persistently <90 mL/min signify possible Chronic  Kidney Disease.    Anion gap 10 5 - 15  Glucose, capillary     Status: None   Collection Time: 03/10/15  8:27 AM  Result Value Ref Range   Glucose-Capillary 76 70 - 99 mg/dL    Imaging / Studies: Dg Abd 2 Views  03/09/2015   CLINICAL DATA:  Subsequent evaluation of abdominal discomfort since yesterday with diagnosis of small bowel obstruction  EXAM: ABDOMEN - 2 VIEW  COMPARISON:  03/08/2015  FINDINGS: Dilated loops of bowel in the left abdomen measuring up to 4 cm and demonstrating air-fluid levels. Other nondistended loops of small bowel in the mid abdomen are identified which also demonstrate air-fluid levels. The colon is decompressed with minimal gas. There is no free air. The degree of distention is not significantly different when compared to the prior study.  IMPRESSION: Small bowel obstruction with similar features as compared to prior radiograph.   Electronically Signed   By: Skipper Cliche M.D.   On: 03/09/2015 08:17    Medications / Allergies:  Scheduled Meds: . antiseptic oral rinse  7 mL Mouth Rinse q12n4p  . chlorhexidine  15 mL Mouth Rinse BID  . ciprofloxacin  400 mg Intravenous Q12H  . enoxaparin (LOVENOX) injection  40 mg Subcutaneous Q24H  . insulin aspart  0-15 Units Subcutaneous 6 times per day  . lidocaine  15 mL Mouth/Throat Once  . metronidazole  500 mg Intravenous 3 times per day   Continuous Infusions: . sodium chloride Stopped (03/09/15 1207)  . sodium chloride 0.9 % 1,000 mL with potassium chloride 20 mEq infusion 125 mL/hr at 03/10/15 0703   PRN Meds:.acetaminophen **OR** acetaminophen, benzocaine, hydrALAZINE, morphine injection, ondansetron **OR** ondansetron (ZOFRAN) IV  Antibiotics: Anti-infectives    Start     Dose/Rate Route Frequency Ordered Stop   03/08/15 1800  ciprofloxacin (CIPRO) IVPB 400 mg     400 mg 200 mL/hr over 60 Minutes Intravenous Every 12 hours 03/08/15 1005 03/13/15 1759   03/08/15 1400  metroNIDAZOLE (FLAGYL) IVPB 500 mg      500 mg 100 mL/hr over 60 Minutes Intravenous 3 times per day 03/08/15 1005     03/08/15 0230  ciprofloxacin (CIPRO) IVPB 400 mg     400 mg 200 mL/hr over 60 Minutes Intravenous  Once 03/08/15 0222 03/08/15 0758   03/08/15 0230  metroNIDAZOLE (FLAGYL) IVPB 500 mg     500 mg 100 mL/hr over 60 Minutes Intravenous  Once 03/08/15 0222 03/08/15 0611        Assessment/Plan HD#3 PSBO, etiology unclear.  Adhesions versus infectious -abdominal exam is benign, she is having bowel function.  May start clears from surgical standpoint. -non surgical abdomen -will follow along for now  Erby Pian, [facility] Pager [phone](7A-4:30P) For consults and floor pages call [phone](7A-4:30P)  03/10/2015 8:53 AM

## 2015-03-10 NOTE — Progress Notes (Signed)
PROGRESS NOTE  Carrie Larson QVZ:[id number] DOB: 10-20-1961 DOA: 03/07/2015 PCP: Jackie Plum, MD  HPI: 54 y/o hispanic female with past medical hx of hypertension, type 2 diabetes mellitus, history of abdominal cholecystectomy, abdominal hysterectomy and cesarean section who presented to med [facility] with abdominal pain since yesterday morning. A CT scan of the abdomen and pelvis was done which showed diffuse dilatation of small bowel loops throughout the abdomen and upper pelvis suggestive of small bowel obstruction with focal narrowing of the small bowel just distal to dilated mid to distal ileum. General surgery consulted.   Subjective / 24 H Interval events  History obtained via spanish interpreter over the phone.  - feeling better this morning, denies pain, still feels gasy - denies chest pain/shortness of breath   Assessment/Plan: Principal Problem:   SBO (small bowel obstruction) Active Problems:   Small bowel obstruction   Type 2 diabetes mellitus   Essential hypertension   UTI (urinary tract infection)   SBO  - general surgery and GI consulted, appreciate input - per surgery less likely adhesions based on their impression and high suspicion for inflammatory component. She has a family history of Chron's disease - improving, d/c ivf, start clears, encourage ambulation -  Empiric Cipro and Flagyl stopped on 3/17 per GI recommendation  Ileitis  - infectious vs inflammatory - has family history of Crohn's disease - patient was seen by GI in [place] about a year ago when she had a colonoscopy, she was told that she has colitis and internal hemorrhoids, nothing more.  - GI input appreciated  Leucocytosis  -associated with SBO. Has UTI as well - resolved this morning on antibiotics  Type 2 DM -hod home meds. Monitor on SSI , a1c pending  Essential HTN - stable. Place on prn IV hydralazine   Diet: Diet clear liquid Fluids: NS DVT Prophylaxis:  Lovenox  Code Status: Full Code Family Communication:  no family bedside  Disposition Plan: remain inpatient  Consultants:  General surgery  GI  Procedures:  None    Antibiotics Ciprofloxacin 3/15 >>3/17 Metronidazole 3/15 >>3/17   Studies  Dg Abd 2 Views  03/09/2015   CLINICAL DATA:  Subsequent evaluation of abdominal discomfort since yesterday with diagnosis of small bowel obstruction  EXAM: ABDOMEN - 2 VIEW  COMPARISON:  03/08/2015  FINDINGS: Dilated loops of bowel in the left abdomen measuring up to 4 cm and demonstrating air-fluid levels. Other nondistended loops of small bowel in the mid abdomen are identified which also demonstrate air-fluid levels. The colon is decompressed with minimal gas. There is no free air. The degree of distention is not significantly different when compared to the prior study.  IMPRESSION: Small bowel obstruction with similar features as compared to prior radiograph.   Electronically Signed   By: Esperanza Heir M.D.   On: 03/09/2015 08:17   Objective  Filed Vitals:   03/09/15 0513 03/09/15 1427 03/09/15 2052 03/10/15 0507  BP: 107/48 99/57 105/60 105/56  Pulse: 68 72 70 64  Temp: 98.6 F (37 C) 98.3 F (36.8 C) 98.8 F (37.1 C) 98.2 F (36.8 C)  TempSrc: Oral Oral Oral Oral  Resp: 17 18 17 17   Height:      Weight:      SpO2: 99% 100% 100% 100%    Intake/Output Summary (Last 24 hours) at 03/10/15 1401 Last data filed at 03/10/15 0541  Gross per 24 hour  Intake 2815.83 ml  Output      0 ml  Net 2815.83 ml   Filed Weights   03/07/15 2127  Weight: 80.287 kg (177 lb)   Exam:  General:  NAD  HEENT: no scleral icterus  Cardiovascular: RRR without MRG, good peripheral pulses, no edema  Respiratory: CTA biL, no wheezing, no crackles  Abdomen: non tender, soft, positive bowel sounds  MSK/Extremities: no clubbing/cyanosis  Skin: no rashes  Neuro: non focal   Data Reviewed: Basic Metabolic Panel:  Recent Labs Lab  03/08/15 0039 03/09/15 0615 03/10/15 0607  NA 137 140 138  K 3.7 3.8 3.8  CL 99 108 107  CO2 GLUCOSE 215* 161* 85  BUN 12 11 5*  CREATININE 0.69 0.84 0.71  CALCIUM 9.5 8.2* 8.1*    Recent Labs Lab 03/08/15 0039  LIPASE 20   CBC:  Recent Labs Lab 03/08/15 0039 03/09/15 0615 03/10/15 0607  WBC 17.5* 6.7 4.5  NEUTROABS 15.8*  --   --   HGB 14.8 11.3* 10.3*  HCT 44.4 35.6* 31.9*  MCV 82.8 85.4 86.0  PLT 410* 281 247   Cardiac Enzymes:  Recent Labs Lab 03/08/15 0039  TROPONINI <0.03   CBG:  Recent Labs Lab 03/09/15 2006 03/09/15 2308 03/10/15 0303 03/10/15 0827 03/10/15 1147  GLUCAP 93 79 80 76 86    Recent Results (from the past 240 hour(s))  Culture, Urine     Status: None   Collection Time: 03/08/15  4:44 PM  Result Value Ref Range Status   Specimen Description URINE, CLEAN CATCH  Final   Special Requests Normal  Final   Colony Count NO GROWTH Performed at [facility]   Final   Culture NO GROWTH Performed at [facility]   Final   Report Status 03/09/2015 FINAL  Final  Clostridium Difficile by PCR     Status: None   Collection Time: 03/09/15  9:04 AM  Result Value Ref Range Status   C difficile by pcr NEGATIVE NEGATIVE Final     Scheduled Meds: . antiseptic oral rinse  7 mL Mouth Rinse q12n4p  . chlorhexidine  15 mL Mouth Rinse BID  . enoxaparin (LOVENOX) injection  40 mg Subcutaneous Q24H  . insulin aspart  0-15 Units Subcutaneous 6 times per day  . lidocaine  15 mL Mouth/Throat Once   Continuous Infusions: . sodium chloride 0.9 % 1,000 mL with potassium chloride 20 mEq infusion 125 mL/hr at 03/10/15 0703  ivf stopped on 3/17  Time spent: 35 minutes   Albertine Grates, MD/PhD [facility] Pager [phone]. If 7 PM - 7 AM, please contact night-coverage at [email or web address], password [facility] 03/10/2015, 2:01 PM  LOS: 2 days

## 2015-03-11 ENCOUNTER — Encounter (HOSPITAL_COMMUNITY): Payer: Self-pay

## 2015-03-11 LAB — BASIC METABOLIC PANEL
Anion gap: 7 (ref 5–15)
BUN: <5 mg/dL — ABNORMAL LOW (ref 6–23)
CO2: 27 mmol/L (ref 19–32)
Calcium: 8.3 mg/dL — ABNORMAL LOW (ref 8.4–10.5)
Chloride: 102 mmol/L (ref 96–112)
Creatinine, Ser: 0.66 mg/dL (ref 0.50–1.10)
Glucose, Bld: 85 mg/dL (ref 70–99)
POTASSIUM: 3.5 mmol/L (ref 3.5–5.1)
Sodium: 136 mmol/L (ref 135–145)

## 2015-03-11 LAB — GI PATHOGEN PANEL BY PCR, STOOL
C DIFFICILE TOXIN A/B: NOT DETECTED
CAMPYLOBACTER BY PCR: NOT DETECTED
Cryptosporidium by PCR: NOT DETECTED
E COLI (ETEC) LT/ST: NOT DETECTED
E COLI 0157 BY PCR: NOT DETECTED
E coli (STEC): NOT DETECTED
G lamblia by PCR: NOT DETECTED
Norovirus GI/GII: NOT DETECTED
Rotavirus A by PCR: NOT DETECTED
SHIGELLA BY PCR: NOT DETECTED
Salmonella by PCR: NOT DETECTED

## 2015-03-11 LAB — CBC
HCT: 34.1 % — ABNORMAL LOW (ref 36.0–46.0)
Hemoglobin: 11.1 g/dL — ABNORMAL LOW (ref 12.0–15.0)
MCH: 27.1 pg (ref 26.0–34.0)
MCHC: 32.6 g/dL (ref 30.0–36.0)
MCV: 83.4 fL (ref 78.0–100.0)
PLATELETS: 278 10*3/uL (ref 150–400)
RBC: 4.09 MIL/uL (ref 3.87–5.11)
RDW: 13.8 % (ref 11.5–15.5)
WBC: 5 10*3/uL (ref 4.0–10.5)

## 2015-03-11 LAB — GLUCOSE, CAPILLARY
Glucose-Capillary: 77 mg/dL (ref 70–99)
Glucose-Capillary: 63 mg/dL — ABNORMAL LOW (ref 70–99)

## 2015-03-11 MED ORDER — POTASSIUM CHLORIDE CRYS ER 20 MEQ PO TBCR
40.0000 meq | EXTENDED_RELEASE_TABLET | Freq: Once | ORAL | Status: AC
  Administered 2015-03-11: 40 meq via ORAL
  Filled 2015-03-11: qty 2

## 2015-03-11 MED ORDER — LISINOPRIL 20 MG PO TABS: 10.0000 mg | ORAL_TABLET | Freq: Every day | ORAL | Status: DC

## 2015-03-11 NOTE — Progress Notes (Signed)
Patient is doing well per IM physician.  She is being discharged home.  No surgical follow up needed.  Follow up with GI.  Michaele Amundson E 11:35 AM 03/11/2015

## 2015-03-11 NOTE — Progress Notes (Signed)
Interpreter Wyvonnia Dusky for RN Merrilee Seashore discharge instructions

## 2015-03-11 NOTE — Progress Notes (Signed)
Discharge instructions reviewed with the patient via Catalina Pizza, the spanish interpreter. Pt asked appropriate questions. Rx reviewed. Pt verbalized understanding and will be ready for discharge.

## 2015-03-11 NOTE — Progress Notes (Signed)
MD notified of pt's blood sugar being 63. Pt began to eat lunch after blood sugar taken. No signs/symptoms of hypoglycemia.

## 2015-03-11 NOTE — Discharge Summary (Addendum)
Discharge Summary  Carrie Larson ZOX:[id number] DOB: 1961-06-01  PCP: Jackie Plum, MD  Admit date: 03/07/2015 Discharge date: 03/11/2015  Time spent: > All communication via spanish interpreter over the phone Recommendations for Outpatient Follow-up:  1. F/u with PMD in one week to repeat bmp, to f/u on a1c result 2. F/u with GI to ensure resolution of SBO, repeat Ct ab/SBFT/colonoscopy per primary GI's discretion.   Discharge Diagnoses:  Active Hospital Problems   Diagnosis Date Noted  . SBO (small bowel obstruction) 03/08/2015  . Diabetes mellitus type 2, noninsulin dependent   . Small bowel obstruction 03/08/2015  . Type 2 diabetes mellitus 03/08/2015  . Essential hypertension 03/08/2015  . UTI (urinary tract infection) 03/08/2015    Resolved Hospital Problems   Diagnosis Date Noted Date Resolved  No resolved problems to display.    Discharge Condition: stable  Diet recommendation: liquid diet for two days, then increase to bland diet, further diet advancement per primary GI instruction  Filed Weights   03/07/15 2127  Weight: 80.287 kg (177 lb)    History of present illness:  54 y/o hispanic female with past medical hx of hypertension, type 2 diabetes mellitus, history of abdominal cholecystectomy, abdominal hysterectomy and cesarean section who presented to med [facility] with abdominal pain since yesterday morning. Patient reports having acute onset of crampy abdominal pain mainly over the epigastric area radiating down towards the periumbilical and left quadrant. This was associated with several episodes of vomiting initially of food particles and then bilious liquid. She denies fever but reports chills and some burning urination. She reports having similar abdominal pain the past but has not required any hospitalization. She also reports history of umbilical hernia and was referred to surgery by her PCP. Patient reports 2 episodes of watery  greenish diarrhea this morning. Patient denies headache, dizziness, fever, chest pain, palpitations, SOB, denies change in weight. Denies any sick contacts or recent travel. Denies eating anything outside.  Course in the ED Patient was afebrile. Blood will done showed leukocytosis with WBC of 17.5, normal hemoglobin and platelets. Chemistry was unremarkable. Lipase was normal. UA was suggestive of UTI. A CT scan of the abdomen and pelvis was done which showed diffuse dilatation of small bowel loops throughout the abdomen and upper pelvis suggestive of small bowel obstruction with focal narrowing of the small bowel just distal to dilated mid to distal ileum. Also sore several additional segments of focal narrowing with wall enhancement and soft tissue inflammation along the mid and distal ileum raising suspicion for underlying deformity small bowel process. Patient given a dose of IV ciprofloxacin and hospitalist admission requested to medical floor.   Hospital Course:  Principal Problem:   SBO (small bowel obstruction) Active Problems:   Small bowel obstruction   Type 2 diabetes mellitus   Essential hypertension   UTI (urinary tract infection)   Diabetes mellitus type 2, noninsulin dependent  SBO  - general surgery and GI consulted, appreciate input - per surgery less likely adhesions based on their impression and high suspicion for inflammatory component. She has a family history of Chron's disease - improving, d/c ivf, start clears, encourage ambulation - Empiric Cipro and Flagyl stopped on 3/17 per GI recommendation  Ileitis  - infectious vs inflammatory - has family history of Crohn's disease - patient was seen by GI in [place] about a year ago when she had a colonoscopy, she was told that she has colitis and internal hemorrhoids, nothing more.  - [facility] consulted in the hospital, recommended to stop abx. -primary GI outpatient follow  up  Leucocytosis  -associated with SBO? Urine culture no growth - resolved   Type 2 DM -hod home meds. Monitor on SSI , a1c pending at time of discharge  Essential HTN - stable. Place on prn IV hydralazine   Code Status: Full Code Family Communication: no family bedside  Consultants:  General surgery  GI  Procedures:  None   Antibiotics Ciprofloxacin 3/15 >>3/17 Metronidazole 3/15 >>3/17  Discharge Exam: BP 102/52 mmHg  Pulse 61  Temp(Src) 98.4 F (36.9 C) (Oral)  Resp 16  Ht 5\' 7"  (1.702 m)  Wt 80.287 kg (177 lb)  BMI 27.72 kg/m2  SpO2 100%   General: NAD  HEENT: no scleral icterus  Cardiovascular: RRR without MRG, good peripheral pulses, no edema  Respiratory: CTA biL, no wheezing, no crackles  Abdomen: non tender, soft, positive bowel sounds  MSK/Extremities: no clubbing/cyanosis  Skin: no rashes  Neuro: non focal   Discharge Instructions You were cared for by a hospitalist during your hospital stay. If you have any questions about your discharge medications or the care you received while you were in the hospital after you are discharged, you can call the unit and asked to speak with the hospitalist on call if the hospitalist that took care of you is not available. Once you are discharged, your primary care physician will handle any further medical issues. Please note that NO REFILLS for any discharge medications will be authorized once you are discharged, as it is imperative that you return to your primary care physician (or establish a relationship with a primary care physician if you do not have one) for your aftercare needs so that they can reassess your need for medications and monitor your lab values.  Discharge Instructions    Diet - low sodium heart healthy    Complete by:  As directed   Liquid diet for two days, then advance to soft/bland diet, further diet advancement per primary GI doctor's instruction.     Increase activity  slowly    Complete by:  As directed             Medication List    STOP taking these medications        HYDROcodone-ibuprofen 7.5-200 MG per tablet  Commonly known as:  VICOPROFEN      TAKE these medications        fluticasone 50 MCG/ACT nasal spray  Commonly known as:  FLONASE  Place 2 sprays into both nostrils daily as needed for allergies or rhinitis.     lisinopril 20 MG tablet  Commonly known as:  PRINIVIL,ZESTRIL  Take 0.5 tablets (10 mg total) by mouth daily.     metFORMIN 500 MG tablet  Commonly known as:  GLUCOPHAGE  Take 500 mg by mouth 2 (two) times daily with a meal.       No Known Allergies     Follow-up Information    Follow up with OSEI-BONSU,GEORGE, MD In 1 week.   Specialty:  Internal Medicine   Why:  pcp to repeat bmp in one week   Contact information:   [address] [place] [address] [phone]       Follow up with Rosina Lowenstein, MD In 1 week.   Specialty:  Internal Medicine   Why:  GI to f/u with sbo, repeat imaging to ensure resolution   Contact information:   [address] [place] [address] [phone]        The results of significant diagnostics from this hospitalization (including imaging, microbiology, ancillary and laboratory) are listed below for reference.    Significant Diagnostic Studies: Ct Abdomen Pelvis W Contrast  03/08/2015   CLINICAL DATA:  Acute onset of mid abdominal pain, nausea and vomiting. Initial encounter.  EXAM: CT ABDOMEN AND PELVIS WITH CONTRAST  TECHNIQUE: Multidetector CT imaging of the abdomen and pelvis was performed using the standard protocol following bolus administration of intravenous contrast.  CONTRAST:  25mL OMNIPAQUE IOHEXOL 300 MG/ML SOLN, OMNIPAQUE IOHEXOL 300 MG/ML SOLN  COMPARISON:  Abdominal radiograph performed earlier today at 12:15 a.m.  FINDINGS: Minimal bibasilar atelectasis is noted.  The liver and spleen are unremarkable in appearance. The  patient is status post cholecystectomy. The pancreas and adrenal glands are unremarkable.  The kidneys are unremarkable in appearance. There is no evidence of hydronephrosis. No renal or ureteral stones are seen. No perinephric stranding is appreciated.  There is diffuse dilatation of small bowel loops throughout the abdomen and upper pelvis, measuring up to 4.4 cm in maximal diameter. This appears to reflect focal narrowing of the small bowel just distal to the dilated mid to distal ileum, with mildly increased wall enhancement and surrounding soft tissue inflammation.  Several additional segments of increased wall enhancement and soft tissue inflammation are seen along the mid and distal ileum, both proximal and distal to the site of obstruction. This raises suspicion for an underlying inflammatory small bowel process. Acute infectious ileitis or malignancy are considered less likely. There is no definite evidence for adhesion.  Trace free fluid is seen at the right mid abdomen, and tracking adjacent to small bowel loops. A small amount of free fluid is noted tracking along the left paracolic gutter, into the pelvis.  The stomach is within normal limits. No acute vascular abnormalities are seen.  The appendix is normal in caliber, without evidence for appendicitis. The colon is largely decompressed and grossly unremarkable in appearance.  The bladder is decompressed and not well assessed. The patient is status post hysterectomy. The ovaries are relatively symmetric. No suspicious adnexal masses are seen. No inguinal lymphadenopathy is seen.  No acute osseous abnormalities are identified.  IMPRESSION: 1. Diffuse dilatation of small bowel loops throughout the abdomen and upper pelvis, compatible with small bowel obstruction. The obstruction appears to reflect focal narrowing of the small bowel just distal to the dilated mid to distal ileum, with mildly increased wall enhancement and surrounding soft tissue  inflammation. Several additional segments of focal narrowing, wall enhancement and soft tissue inflammation are seen along the mid and distal ileum, both proximal and distal to the site of obstruction. This raises suspicion for underlying inflammatory small bowel process. Acute infectious ileitis or malignancy are considered less likely. 2. Trace associated free fluid noted tracking about small bowel loops and extending into the pelvis.  These results were called by telephone at the time of interpretation on 03/08/2015 at 2:24 am to Dr. Cy Blamer, who verbally acknowledged these results.   Electronically Signed   By: Roanna Raider M.D.   On: 03/08/2015 02:24   Dg Abd 2 Views  03/09/2015   CLINICAL DATA:  Subsequent evaluation of abdominal discomfort since yesterday with diagnosis of small bowel obstruction  EXAM: ABDOMEN - 2 VIEW  COMPARISON:  03/08/2015  FINDINGS: Dilated loops of bowel in the left abdomen measuring up to 4 cm and demonstrating air-fluid levels.  Other nondistended loops of small bowel in the mid abdomen are identified which also demonstrate air-fluid levels. The colon is decompressed with minimal gas. There is no free air. The degree of distention is not significantly different when compared to the prior study.  IMPRESSION: Small bowel obstruction with similar features as compared to prior radiograph.   Electronically Signed   By: Esperanza Heir M.D.   On: 03/09/2015 08:17   Dg Abd Acute W/chest  03/08/2015   CLINICAL DATA:  Acute onset of central abdominal pain, with nausea and vomiting. Initial encounter.  EXAM: ACUTE ABDOMEN SERIES (ABDOMEN 2 VIEW & CHEST 1 VIEW)  COMPARISON:  None.  FINDINGS: The lungs are well-aerated and clear. There is no evidence of focal opacification, pleural effusion or pneumothorax. The cardiomediastinal silhouette is within normal limits.  There is dilatation of small bowel loops to 4.1 cm in maximal diameter at the mid abdomen, raising concern for some  degree of bowel obstruction. Associated air-fluid levels are seen. A small amount of stool is still seen within the colon. No free intra-abdominal air is identified on the provided upright view.  No acute osseous abnormalities are seen; the sacroiliac joints are unremarkable in appearance.  IMPRESSION: 1. Dilatation of a small bowel loops to 4.1 cm, with associated air-fluid levels, at the mid abdomen. This raises concern for some degree of bowel obstruction. CT of the abdomen and pelvis would be helpful for further evaluation. No free intra-abdominal air seen. 2. No acute cardiopulmonary process identified.  These results were called by telephone at the time of interpretation on 03/08/2015 at 12:15 am to Dr. Cy Blamer, who verbally acknowledged these results.   Electronically Signed   By: Roanna Raider M.D.   On: 03/08/2015 00:16    Microbiology: Recent Results (from the past 240 hour(s))  Culture, Urine     Status: None   Collection Time: 03/08/15  4:44 PM  Result Value Ref Range Status   Specimen Description URINE, CLEAN CATCH  Final   Special Requests Normal  Final   Colony Count NO GROWTH Performed at [facility]   Final   Culture NO GROWTH Performed at [facility]   Final   Report Status 03/09/2015 FINAL  Final  Clostridium Difficile by PCR     Status: None   Collection Time: 03/09/15  9:04 AM  Result Value Ref Range Status   C difficile by pcr NEGATIVE NEGATIVE Final     Labs: Basic Metabolic Panel:  Recent Labs Lab 03/08/15 0039 03/09/15 0615 03/10/15 0607 03/11/15 0425  NA 137 140 138 136  K 3.7 3.8 3.8 3.5  CL 99 108 107 102  CO2 GLUCOSE 215* 161* 85 85  BUN 12 11 5* <5*  CREATININE 0.69 0.84 0.71 0.66  CALCIUM 9.5 8.2* 8.1* 8.3*   Liver Function Tests: No results for input(s): AST, ALT, ALKPHOS, BILITOT, PROT, ALBUMIN in the last 168 hours.  Recent Labs Lab 03/08/15 0039  LIPASE 20   No results for input(s): AMMONIA in the  last 168 hours. CBC:  Recent Labs Lab 03/08/15 0039 03/09/15 0615 03/10/15 0607 03/11/15 0425  WBC 17.5* 6.7 4.5 5.0  NEUTROABS 15.8*  --   --   --   HGB 14.8 11.3* 10.3* 11.1*  HCT 44.4 35.6* 31.9* 34.1*  MCV 82.8 85.4 86.0 83.4  PLT 410* 281 247 278   Cardiac Enzymes:  Recent Labs Lab 03/08/15 0039  TROPONINI <0.03  BNP: BNP (last 3 results) No results for input(s): BNP in the last 8760 hours.  ProBNP (last 3 results) No results for input(s): PROBNP in the last 8760 hours.  CBG:  Recent Labs Lab 03/10/15 0827 03/10/15 1147 03/10/15 1709 03/10/15 2217 03/11/15 0807  GLUCAP 76 86 86 84 77       Signed:  Seymore Brodowski MD, PhD  [facility] 03/11/2015, 10:25 AM

## 2015-03-12 LAB — HEMOGLOBIN A1C
HEMOGLOBIN A1C: 6 % — AB (ref 4.8–5.6)
Mean Plasma Glucose: 126 mg/dL

## 2015-03-14 ENCOUNTER — Encounter (INDEPENDENT_AMBULATORY_CARE_PROVIDER_SITE_OTHER): Payer: Self-pay | Admitting: General Surgery

## 2015-03-25 ENCOUNTER — Encounter (HOSPITAL_COMMUNITY): Payer: Self-pay | Admitting: *Deleted

## 2015-03-25 DIAGNOSIS — Z6826 Body mass index (BMI) 26.0-26.9, adult: Secondary | ICD-10-CM

## 2015-03-25 DIAGNOSIS — K567 Ileus, unspecified: Secondary | ICD-10-CM | POA: Diagnosis present

## 2015-03-25 DIAGNOSIS — R634 Abnormal weight loss: Secondary | ICD-10-CM | POA: Diagnosis present

## 2015-03-25 DIAGNOSIS — I10 Essential (primary) hypertension: Secondary | ICD-10-CM | POA: Diagnosis present

## 2015-03-25 DIAGNOSIS — E119 Type 2 diabetes mellitus without complications: Secondary | ICD-10-CM | POA: Diagnosis present

## 2015-03-25 DIAGNOSIS — Z90710 Acquired absence of both cervix and uterus: Secondary | ICD-10-CM

## 2015-03-25 DIAGNOSIS — M1990 Unspecified osteoarthritis, unspecified site: Secondary | ICD-10-CM | POA: Diagnosis present

## 2015-03-25 DIAGNOSIS — R112 Nausea with vomiting, unspecified: Secondary | ICD-10-CM | POA: Diagnosis not present

## 2015-03-25 DIAGNOSIS — Z87440 Personal history of urinary (tract) infections: Secondary | ICD-10-CM

## 2015-03-25 DIAGNOSIS — Z9049 Acquired absence of other specified parts of digestive tract: Secondary | ICD-10-CM | POA: Diagnosis present

## 2015-03-25 DIAGNOSIS — Z79899 Other long term (current) drug therapy: Secondary | ICD-10-CM

## 2015-03-25 DIAGNOSIS — K5660 Unspecified intestinal obstruction: Principal | ICD-10-CM | POA: Diagnosis present

## 2015-03-25 DIAGNOSIS — Z888 Allergy status to other drugs, medicaments and biological substances status: Secondary | ICD-10-CM

## 2015-03-25 NOTE — ED Notes (Signed)
Pt c/o pain since this afternoon. States she has a history of same d/t obstruction.

## 2015-03-26 ENCOUNTER — Emergency Department (HOSPITAL_COMMUNITY): Payer: BLUE CROSS/BLUE SHIELD

## 2015-03-26 ENCOUNTER — Inpatient Hospital Stay (HOSPITAL_COMMUNITY)
Admission: EM | Admit: 2015-03-26 | Discharge: 2015-03-27 | DRG: 390 | Disposition: A | Discharge status: Home / Self Care | Payer: BLUE CROSS/BLUE SHIELD | Attending: Internal Medicine | Admitting: Internal Medicine

## 2015-03-26 DIAGNOSIS — R634 Abnormal weight loss: Secondary | ICD-10-CM | POA: Diagnosis present

## 2015-03-26 DIAGNOSIS — R112 Nausea with vomiting, unspecified: Secondary | ICD-10-CM | POA: Insufficient documentation

## 2015-03-26 DIAGNOSIS — Z9049 Acquired absence of other specified parts of digestive tract: Secondary | ICD-10-CM | POA: Diagnosis present

## 2015-03-26 DIAGNOSIS — E119 Type 2 diabetes mellitus without complications: Secondary | ICD-10-CM | POA: Diagnosis present

## 2015-03-26 DIAGNOSIS — E118 Type 2 diabetes mellitus with unspecified complications: Secondary | ICD-10-CM | POA: Insufficient documentation

## 2015-03-26 DIAGNOSIS — K567 Ileus, unspecified: Secondary | ICD-10-CM | POA: Diagnosis present

## 2015-03-26 DIAGNOSIS — K5669 Other intestinal obstruction: Secondary | ICD-10-CM

## 2015-03-26 DIAGNOSIS — Z79899 Other long term (current) drug therapy: Secondary | ICD-10-CM | POA: Diagnosis not present

## 2015-03-26 DIAGNOSIS — R1084 Generalized abdominal pain: Secondary | ICD-10-CM | POA: Diagnosis not present

## 2015-03-26 DIAGNOSIS — M1990 Unspecified osteoarthritis, unspecified site: Secondary | ICD-10-CM | POA: Diagnosis present

## 2015-03-26 DIAGNOSIS — R109 Unspecified abdominal pain: Secondary | ICD-10-CM

## 2015-03-26 DIAGNOSIS — Z90710 Acquired absence of both cervix and uterus: Secondary | ICD-10-CM | POA: Diagnosis not present

## 2015-03-26 DIAGNOSIS — Z6826 Body mass index (BMI) 26.0-26.9, adult: Secondary | ICD-10-CM | POA: Diagnosis not present

## 2015-03-26 DIAGNOSIS — K56609 Unspecified intestinal obstruction, unspecified as to partial versus complete obstruction: Secondary | ICD-10-CM

## 2015-03-26 DIAGNOSIS — K5660 Unspecified intestinal obstruction: Secondary | ICD-10-CM | POA: Diagnosis present

## 2015-03-26 DIAGNOSIS — Z87440 Personal history of urinary (tract) infections: Secondary | ICD-10-CM | POA: Diagnosis not present

## 2015-03-26 DIAGNOSIS — I10 Essential (primary) hypertension: Secondary | ICD-10-CM | POA: Diagnosis present

## 2015-03-26 DIAGNOSIS — Z888 Allergy status to other drugs, medicaments and biological substances status: Secondary | ICD-10-CM | POA: Diagnosis not present

## 2015-03-26 LAB — BASIC METABOLIC PANEL
Anion gap: 6 (ref 5–15)
BUN: <5 mg/dL — ABNORMAL LOW (ref 6–23)
CHLORIDE: 106 mmol/L (ref 96–112)
CO2: 27 mmol/L (ref 19–32)
Calcium: 9 mg/dL (ref 8.4–10.5)
Creatinine, Ser: 0.72 mg/dL (ref 0.50–1.10)
Glucose, Bld: 100 mg/dL — ABNORMAL HIGH (ref 70–99)
POTASSIUM: 4.4 mmol/L (ref 3.5–5.1)
Sodium: 139 mmol/L (ref 135–145)

## 2015-03-26 LAB — URINALYSIS, ROUTINE W REFLEX MICROSCOPIC
BILIRUBIN URINE: NEGATIVE
Glucose, UA: NEGATIVE mg/dL
Hgb urine dipstick: NEGATIVE
Ketones, ur: 15 mg/dL — AB
Leukocytes, UA: NEGATIVE
NITRITE: NEGATIVE
PROTEIN: NEGATIVE mg/dL
Specific Gravity, Urine: 1.014 (ref 1.005–1.030)
Urobilinogen, UA: 1 mg/dL (ref 0.0–1.0)
pH: 8 (ref 5.0–8.0)

## 2015-03-26 LAB — GLUCOSE, CAPILLARY
GLUCOSE-CAPILLARY: 116 mg/dL — AB (ref 70–99)
GLUCOSE-CAPILLARY: 93 mg/dL (ref 70–99)
GLUCOSE-CAPILLARY: 123 mg/dL — AB (ref 70–99)
GLUCOSE-CAPILLARY: 89 mg/dL (ref 70–99)
Glucose-Capillary: 96 mg/dL (ref 70–99)
Glucose-Capillary: 80 mg/dL (ref 70–99)

## 2015-03-26 LAB — CBC WITH DIFFERENTIAL/PLATELET
BASOS PCT: 0 % (ref 0–1)
Basophils Absolute: 0 10*3/uL (ref 0.0–0.1)
EOS ABS: 0.2 10*3/uL (ref 0.0–0.7)
Eosinophils Relative: 2 % (ref 0–5)
HEMATOCRIT: 38.7 % (ref 36.0–46.0)
HEMOGLOBIN: 12.8 g/dL (ref 12.0–15.0)
LYMPHS ABS: 1.1 10*3/uL (ref 0.7–4.0)
LYMPHS PCT: 11 % — AB (ref 12–46)
MCH: 27.6 pg (ref 26.0–34.0)
MCHC: 33.1 g/dL (ref 30.0–36.0)
MCV: 83.6 fL (ref 78.0–100.0)
Monocytes Absolute: 0.7 10*3/uL (ref 0.1–1.0)
Monocytes Relative: 7 % (ref 3–12)
NEUTROS PCT: 80 % — AB (ref 43–77)
Neutro Abs: 7.7 10*3/uL (ref 1.7–7.7)
PLATELETS: 404 10*3/uL — AB (ref 150–400)
RBC: 4.63 MIL/uL (ref 3.87–5.11)
RDW: 13.9 % (ref 11.5–15.5)
WBC: 9.7 10*3/uL (ref 4.0–10.5)

## 2015-03-26 LAB — COMPREHENSIVE METABOLIC PANEL
ALT: 21 U/L (ref 0–35)
ANION GAP: 9 (ref 5–15)
AST: 18 U/L (ref 0–37)
Albumin: 3.6 g/dL (ref 3.5–5.2)
Alkaline Phosphatase: 101 U/L (ref 39–117)
BUN: 6 mg/dL (ref 6–23)
CALCIUM: 9.8 mg/dL (ref 8.4–10.5)
CHLORIDE: 101 mmol/L (ref 96–112)
CO2: 28 mmol/L (ref 19–32)
CREATININE: 0.72 mg/dL (ref 0.50–1.10)
Glucose, Bld: 156 mg/dL — ABNORMAL HIGH (ref 70–99)
Potassium: 4.6 mmol/L (ref 3.5–5.1)
Sodium: 138 mmol/L (ref 135–145)
Total Bilirubin: 1 mg/dL (ref 0.3–1.2)
Total Protein: 7.8 g/dL (ref 6.0–8.3)

## 2015-03-26 LAB — SEDIMENTATION RATE: SED RATE: 41 mm/h — AB (ref 0–22)

## 2015-03-26 LAB — CBC
HCT: 36.2 % (ref 36.0–46.0)
Hemoglobin: 12 g/dL (ref 12.0–15.0)
MCH: 28 pg (ref 26.0–34.0)
MCHC: 33.1 g/dL (ref 30.0–36.0)
MCV: 84.4 fL (ref 78.0–100.0)
Platelets: 359 10*3/uL (ref 150–400)
RBC: 4.29 MIL/uL (ref 3.87–5.11)
RDW: 14.1 % (ref 11.5–15.5)
WBC: 7.9 10*3/uL (ref 4.0–10.5)

## 2015-03-26 LAB — I-STAT TROPONIN, ED: TROPONIN I, POC: 0 ng/mL (ref 0.00–0.08)

## 2015-03-26 LAB — CBG MONITORING, ED: GLUCOSE-CAPILLARY: 123 mg/dL — AB (ref 70–99)

## 2015-03-26 LAB — C-REACTIVE PROTEIN: CRP: 3.1 mg/dL — ABNORMAL HIGH (ref <0.60)

## 2015-03-26 LAB — LIPASE, BLOOD: Lipase: 14 U/L (ref 11–59)

## 2015-03-26 MED ORDER — HYDRALAZINE HCL 20 MG/ML IJ SOLN: 5.0000 mg | Freq: Four times a day (QID) | INTRAMUSCULAR | Status: DC | PRN

## 2015-03-26 MED ORDER — ONDANSETRON HCL 4 MG/2ML IJ SOLN: 4.0000 mg | Freq: Four times a day (QID) | INTRAMUSCULAR | Status: DC | PRN

## 2015-03-26 MED ORDER — ONDANSETRON HCL 4 MG/2ML IJ SOLN
4.0000 mg | Freq: Once | INTRAMUSCULAR | Status: AC
  Administered 2015-03-26: 4 mg via INTRAVENOUS
  Filled 2015-03-26: qty 2

## 2015-03-26 MED ORDER — SODIUM CHLORIDE 0.9 % IV SOLN: INTRAVENOUS | Status: AC

## 2015-03-26 MED ORDER — MORPHINE SULFATE 4 MG/ML IJ SOLN
4.0000 mg | Freq: Once | INTRAMUSCULAR | Status: AC
  Administered 2015-03-26: 4 mg via INTRAVENOUS
  Filled 2015-03-26: qty 1

## 2015-03-26 MED ORDER — ENOXAPARIN SODIUM 40 MG/0.4ML ~~LOC~~ SOLN
40.0000 mg | SUBCUTANEOUS | Status: DC
  Administered 2015-03-26: 40 mg via SUBCUTANEOUS
  Filled 2015-03-26 (×3): qty 0.4

## 2015-03-26 MED ORDER — OXYCODONE HCL 5 MG PO TABS: 5.0000 mg | ORAL_TABLET | ORAL | Status: DC | PRN

## 2015-03-26 MED ORDER — ACETAMINOPHEN 325 MG PO TABS: 650.0000 mg | ORAL_TABLET | Freq: Four times a day (QID) | ORAL | Status: DC | PRN

## 2015-03-26 MED ORDER — ONDANSETRON HCL 4 MG PO TABS: 4.0000 mg | ORAL_TABLET | Freq: Four times a day (QID) | ORAL | Status: DC | PRN

## 2015-03-26 MED ORDER — SODIUM CHLORIDE 0.9 % IV BOLUS (SEPSIS)
1000.0000 mL | Freq: Once | INTRAVENOUS | Status: AC
  Administered 2015-03-26: 1000 mL via INTRAVENOUS

## 2015-03-26 MED ORDER — HYDROMORPHONE HCL 1 MG/ML IJ SOLN: 0.5000 mg | INTRAMUSCULAR | Status: DC | PRN

## 2015-03-26 MED ORDER — ACETAMINOPHEN 650 MG RE SUPP: 650.0000 mg | Freq: Four times a day (QID) | RECTAL | Status: DC | PRN

## 2015-03-26 MED ORDER — INSULIN ASPART 100 UNIT/ML ~~LOC~~ SOLN
0.0000 [IU] | SUBCUTANEOUS | Status: DC
  Administered 2015-03-26: 1 [IU] via SUBCUTANEOUS
  Filled 2015-03-26: qty 1

## 2015-03-26 MED ORDER — PANTOPRAZOLE SODIUM 40 MG IV SOLR
40.0000 mg | INTRAVENOUS | Status: DC
  Administered 2015-03-26 – 2015-03-27 (×2): 40 mg via INTRAVENOUS
  Filled 2015-03-26 (×4): qty 40

## 2015-03-26 MED ORDER — SODIUM CHLORIDE 0.9 % IV SOLN
INTRAVENOUS | Status: DC
  Administered 2015-03-26 (×2): via INTRAVENOUS

## 2015-03-26 NOTE — ED Provider Notes (Signed)
This chart was scribed for Layla Maw Carlisle Torgeson, DO by Bronson Curb, ED Scribe. This patient was seen in room A13C/A13C and the patient's care was started at 1:41 AM.  TIME SEEN: 0141  CHIEF COMPLAINT: Abdominal Pain  HPI:   HPI Comments: Carrie Larson is a 54 y.o. female, with history of arthritis, HTN, small bowel obstruction, and DM,  who presents to the Emergency Department complaining of constant, moderate, generalized abdominal pain since yesterday afternoon (03/25/2015). There is associated nausea, vomiting (2 episodes with onset, but none prior), flatulence, and bloating. Patient notes relief when passing flatus and reports "everything" exacerbates her pain including eating and not eating. Patient reports her last normal BM was in the last few minutes, however, she is unable to describe whether it was bloody or tarry. Patient was seen here approximately 2 weeks ago for the same and states her symptoms resolved only to return yesterday. At discharge, she was informed to f/u with GI. Patient states she was told that she needed to have an endoscopy. She received a colonoscopy 1 year ago and was diagnosed with colitis.  Patient is currently only on Protonix, but states this "makes her feel bad". She reports history of abdominal surgery for hernia repair approximately 2 years ago. She denies diarrhea. Patient has allergies to Reglan. Pt had SBO 2 weeks ago (diagnosed on CT scan) and symptoms improved but never resolved and now back worse today.  Was seen by surgery and GI at the time.  Unable to tolerate NG tube given nasal polyps and h/o sinus surgery.    PCP: Jackie Plum, MD    ROS: See HPI Constitutional: no fever  Eyes: no drainage  ENT: no runny nose   Cardiovascular:  no chest pain  Resp: no SOB  GI: vomiting GU: no dysuria Integumentary: no rash  Allergy: no hives  Musculoskeletal: no leg swelling  Neurological: no slurred speech ROS otherwise negative  PAST MEDICAL  HISTORY/PAST SURGICAL HISTORY:  Past Medical History  Diagnosis Date  . Arthritis   . Hypertension   . Diabetes mellitus without complication   . Environmental and seasonal allergies     cats & dogs also  . Small bowel obstruction 02/2015  . Complication of anesthesia     " a little anesthesia has a big affect "  . Nasal polyps   . Arthritis 02/2014    hips, elbows, shoulders.     MEDICATIONS:  Prior to Admission medications   Medication Sig Start Date End Date Taking? Authorizing Provider  captopril (CAPOTEN) 12.5 MG tablet Take 12.5 mg by mouth 1 day or 1 dose.    Historical Provider, MD  fluticasone (FLONASE) 50 MCG/ACT nasal spray Place 2 sprays into both nostrils daily as needed for allergies or rhinitis.    Historical Provider, MD  lisinopril (PRINIVIL,ZESTRIL) 20 MG tablet Take 0.5 tablets (10 mg total) by mouth daily. 03/11/15   Albertine Grates, MD  Meclizine HCl (MEDI-MECLIZINE PO) Take by mouth daily.    Historical Provider, MD  metFORMIN (GLUCOPHAGE) 500 MG tablet Take 500 mg by mouth 2 (two) times daily with a meal.    Historical Provider, MD    ALLERGIES:  Allergies  Allergen Reactions  . Reglan [Metoclopramide]     SOCIAL HISTORY:  History  Substance Use Topics  . Smoking status: Never Smoker   . Smokeless tobacco: Never Used  . Alcohol Use: No    FAMILY HISTORY: No family history on file.  EXAM:  Triage Vitals: BP 131/71  mmHg  Pulse 94  Temp(Src) 98.4 F (36.9 C) (Oral)  Resp 22  Ht  (1.702 m)  Wt 170 lb (77.111 kg)  BMI 26.62 kg/m2  SpO2 100%  CONSTITUTIONAL: Alert and oriented and responds appropriately to questions. Well-appearing; well-nourished HEAD: Normocephalic EYES: Conjunctivae clear, PERRL ENT: normal nose; no rhinorrhea; moist mucous membranes; pharynx without lesions noted NECK: Supple, no meningismus, no LAD  CARD: RRR; S1 and S2 appreciated; no murmurs, no clicks, no rubs, no gallops RESP: Normal chest excursion without splinting or  tachypnea; breath sounds clear and equal bilaterally; no wheezes, no rhonchi, no rales,  ABD/GI: Normal bowel sounds; soft, diffusely tender to palpation, no rebound, no guarding. Some tympany. No fluid wave. Mild distention. BACK:  The back appears normal and is non-tender to palpation, there is no CVA tenderness EXT: Normal ROM in all joints; non-tender to palpation; no edema; normal capillary refill; no cyanosis    SKIN: Normal color for age and race; warm NEURO: Moves all extremities equally PSYCH: The patient's mood and manner are appropriate. Grooming and personal hygiene are appropriate.  MEDICAL DECISION MAKING: Pt here with SBO seen on xray.  No free air.  Labs and urine normal.  D/w Dr. Lovell Sheehan for admission to inpt, medical bed.  Will keep NPO, will continue IVF, pain and nausea meds. No peritoneal abdomen.  Updated pt and family.    I personally performed the services described in this documentation, which was scribed in my presence. The recorded information has been reviewed and is accurate.    Layla Maw Jonn Chaikin, DO 03/26/15 1744

## 2015-03-26 NOTE — ED Notes (Signed)
Patient called x1.  No response in waiting room.

## 2015-03-26 NOTE — Progress Notes (Signed)
Pt admitted to room 518 alert and oriented x 4 moe x4 pain level a "O" per pt. Last Bm 03/25/15. positive bowel sounds all quadrants. No skin breakdown noted. Iv patent. Full code family at bedside.

## 2015-03-26 NOTE — H&P (Signed)
[facility] Admission History and Physical       Carrie Larson ZOX:[id number] DOB: [birth year] DOA: 03/26/2015  Referring physician: EDP PCP: Jackie Plum, MD  Specialists:   Chief Complaint: ABD Pain Nausea and Vomiting  HPI: Carrie Larson is a 54 y.o. female with a history of DM2, HTN, and Multiple ABD Surgeries including an Open Cholecystectomy, C-Section, and a TAH who presents to the ED with complaints of ABD Pain and Distension with nausea and vomiting during the past 24 hours.   She denies any bilious emesis or hematemesis.  She also denies fevers and chills.   At the worst, her ABD pain is a 9/10, and the pain starts in the epigastric area and radiates down the left side of her ABD.   She was able to pass a bowel movement earlier in the day.   She was admitted on 03/16 with the same symptoms and was found at that time to have a SBO on CT scan.   A ABD series was done tonight which revealed findings suggestive of a SBO at this time.     Review of Systems:  Constitutional: No Weight Loss, No Weight Gain, Night Sweats, Fevers, Chills, Dizziness, Light Headedness, Fatigue, or Generalized Weakness HEENT: No Headaches, Difficulty Swallowing,Tooth/Dental Problems,Sore Throat,  No Sneezing, Rhinitis, Ear Ache, Nasal Congestion, or Post Nasal Drip,  Cardio-vascular:  No Chest pain, Orthopnea, PND, Edema in Lower Extremities, Anasarca, Dizziness, Palpitations  Resp: No Dyspnea, No DOE, No Productive Cough, No Non-Productive Cough, No Hemoptysis, No Wheezing.    GI: No Heartburn, Indigestion, +Abdominal Pain, +Nausea, +Vomiting, Diarrhea, Constipation, Hematemesis, Hematochezia, Melena, Change in Bowel Habits,  Loss of Appetite  GU: No Dysuria, No Change in Color of Urine, No Urgency or Urinary Frequency, No Flank pain.  Musculoskeletal: No Joint Pain or Swelling, No Decreased Range of Motion, No Back Pain.  Neurologic: No Syncope, No Seizures, Muscle Weakness, Paresthesia, Vision  Disturbance or Loss, No Diplopia, No Vertigo, No Difficulty Walking,  Skin: No Rash or Lesions. Psych: No Change in Mood or Affect, No Depression or Anxiety, No Memory loss, No Confusion, or Hallucinations   Past Medical History  Diagnosis Date  . Arthritis   . Hypertension   . Diabetes mellitus without complication   . Environmental and seasonal allergies     cats & dogs also  . Small bowel obstruction 02/2015  . Complication of anesthesia     " a little anesthesia has a big affect "  . Nasal polyps   . Arthritis 02/2014    hips, elbows, shoulders.      Past Surgical History  Procedure Laterality Date  . Cholecystectomy    . Cholecystectomy      open.   . Abdominal hysterectomy    . Debridement of abdominal wall abscess    . Cesarean section    . Galactocele      left breast  . Colonoscopy  02/2014    Dr Norma Fredrickson at [facility], [place].  normal TI and colon except for int/ext hemorrhoids.  Random biopsies negative.       Prior to Admission medications   Medication Sig Start Date End Date Taking? Authorizing Provider  captopril (CAPOTEN) 12.5 MG tablet Take 12.5 mg by mouth 1 day or 1 dose.    Historical Provider, MD  fluticasone (FLONASE) 50 MCG/ACT nasal spray Place 2 sprays into both nostrils daily as needed for allergies or rhinitis.    Historical Provider, MD  lisinopril (PRINIVIL,ZESTRIL) 20 MG tablet Take  0.5 tablets (10 mg total) by mouth daily. 03/11/15   Albertine Grates, MD  Meclizine HCl (MEDI-MECLIZINE PO) Take by mouth daily.    Historical Provider, MD  metFORMIN (GLUCOPHAGE) 500 MG tablet Take 500 mg by mouth 2 (two) times daily with a meal.    Historical Provider, MD     Allergies  Allergen Reactions  . Reglan [Metoclopramide]     Social History:  reports that she has never smoked. She has never used smokeless tobacco. She reports that she does not drink alcohol or use illicit drugs.    No family history on file.     Physical Exam:  GEN:  Pleasant Well  Nourished and Well Developed  54 y.o. Hispanic female examined and in no acute distress; cooperative with exam Filed Vitals:   03/26/15 0330 03/26/15 0400 03/26/15 0453 03/26/15 0456  BP: 119/64 112/65  106/68  Pulse: 80 77  77  Temp:    99.3 F (37.4 C)  TempSrc:    Oral  Resp: 16   12  Height:    (1.702 m)   Weight:   77.066 kg (169 lb 14.4 oz)   SpO2: 98% 97%  98%   Blood pressure 106/68, pulse 77, temperature 99.3 F (37.4 C), temperature source Oral, resp. rate 12, height  (1.702 m), weight 77.066 kg (169 lb 14.4 oz), SpO2 98 %. PSYCH: She is alert and oriented x4; does not appear anxious does not appear depressed; affect is normal HEENT: Normocephalic and Atraumatic, Mucous membranes pink; PERRLA; EOM intact; Fundi:  Benign;  No scleral icterus, Nares: Patent, Oropharynx: Clear,  Poor Dentition,    Neck:  FROM, No Cervical Lymphadenopathy nor Thyromegaly or Carotid Bruit; No JVD; Breasts:: Not examined CHEST WALL: No tenderness CHEST: Normal respiration, clear to auscultation bilaterally HEART: Regular rate and rhythm; no murmurs rubs or gallops BACK: No kyphosis or scoliosis; No CVA tenderness ABDOMEN: Positive Bowel Sounds, Soft , Mildly Tender, No Rebound or Guarding; No Masses, No Organomegaly. Rectal Exam: Not done EXTREMITIES: No Cyanosis, Clubbing, or Edema; No Ulcerations. Genitalia: not examined PULSES: 2+ and symmetric SKIN: Normal hydration no rash or ulceration CNS:  Alert and Oriented x 4, No Focal Deficits Vascular: pulses palpable throughout    Labs on Admission:  Basic Metabolic Panel:  Recent Labs Lab 03/26/15 0013  NA 138  K 4.6  CL 101  CO2 28  GLUCOSE 156*  BUN 6  CREATININE 0.72  CALCIUM 9.8   Liver Function Tests:  Recent Labs Lab 03/26/15 0013  AST 18  ALT 21  ALKPHOS 101  BILITOT 1.0  PROT 7.8  ALBUMIN 3.6    Recent Labs Lab 03/26/15 0013  LIPASE 14   No results for input(s): AMMONIA in the last 168  hours. CBC:  Recent Labs Lab 03/26/15 0013  WBC 9.7  NEUTROABS 7.7  HGB 12.8  HCT 38.7  MCV 83.6  PLT 404*   Cardiac Enzymes: No results for input(s): CKTOTAL, CKMB, CKMBINDEX, TROPONINI in the last 168 hours.  BNP (last 3 results) No results for input(s): BNP in the last 8760 hours.  ProBNP (last 3 results) No results for input(s): PROBNP in the last 8760 hours.  CBG:  Recent Labs Lab 03/26/15 0356 03/26/15 0457  GLUCAP 123* 116*    Radiological Exams on Admission: Dg Abd Acute W/chest  03/26/2015   CLINICAL DATA:  Generalized abdominal pain with nausea and vomiting.  EXAM: ACUTE ABDOMEN SERIES (ABDOMEN 2 VIEW & CHEST 1 VIEW)  COMPARISON:  None.  FINDINGS: There is abnormal dilatation of small bowel with air-fluid levels. The colon appears relatively decompressed, with air present throughout. No free intraperitoneal air is evident. No significant cardiopulmonary abnormality is evident on the upright view of the chest.  IMPRESSION: Abnormal small bowel dilatation with air-fluid levels, suspicious for a small bowel obstruction.   Electronically Signed   By: Ellery Plunk M.D.   On: 03/26/2015 03:03     EKG: Independently reviewed.    Assessment/Plan:   54 y.o. female with   Active Problems:   1.    SBO (small bowel obstruction)   NPO ( Bowel Rest)   IVFs   IV Anti-Emetics PRN   If not Improving , May need Surgical Consultation       2.    ABD Pain- Due to #1   Pain Control PRN with IV Dilaudid        3.    Nausea and Vomiting- Due to #1   PRN IV Zofran     2.    Essential HTN   Monitor BPs   PRN IV Hydralazine     3.    DM2   SSI coverage PRN   Monitor Glucose levels Every 4 hrs     4.    DVT Prophylaxis     SCDs      Code Status:     FULL CODE        Family Communication:   Husband and Son are at Bedside   Disposition Plan:    Inpatient  Status        Time spent:  57  Minutes      Ron Parker [facility] Pager  [phone]   If 7AM -7PM Please Contact the Day Rounding Team MD for [facility]  If 7PM-7AM, Please Contact Night-Floor Coverage  [email or web address] Password [facility] 03/26/2015, 6:19 AM     ADDENDUM:   Patient was seen and examined on 03/26/2015

## 2015-03-26 NOTE — Progress Notes (Addendum)
Patient seen and examined Denies any abdominal pain or nausea today  Recently Admitted 3/14 -3/18 with similar symptoms  Plan Small bowel obstruction Seen by GI and general surgery during recent admission per surgery less likely adhesions based on their impression and high suspicion for inflammatory component. She has a family history of Chron's disease Continue ivf, start clears, encourage ambulation  Empiric Cipro and Flagyl stopped on 3/17 per GI recommendation Check ESR, CRP Advance diet slowly, if tolerates patient will go home in 1-2 days   Type 2 DM Continue home meds. Monitor on SSI , hemoglobin A1c 6.0. Takes metformin at home  Essential HTN - stable. Place on prn IV hydralazine Takes lisinopril and home

## 2015-03-27 ENCOUNTER — Inpatient Hospital Stay (HOSPITAL_COMMUNITY): Payer: BLUE CROSS/BLUE SHIELD

## 2015-03-27 LAB — COMPREHENSIVE METABOLIC PANEL
ALT: 22 U/L (ref 0–35)
AST: 25 U/L (ref 0–37)
Albumin: 3 g/dL — ABNORMAL LOW (ref 3.5–5.2)
Alkaline Phosphatase: 81 U/L (ref 39–117)
Anion gap: 7 (ref 5–15)
BUN: <5 mg/dL — ABNORMAL LOW (ref 6–23)
CHLORIDE: 105 mmol/L (ref 96–112)
CO2: 27 mmol/L (ref 19–32)
Calcium: 8.7 mg/dL (ref 8.4–10.5)
Creatinine, Ser: 0.66 mg/dL (ref 0.50–1.10)
GFR calc non Af Amer: >90 mL/min (ref >90)
GLUCOSE: 102 mg/dL — AB (ref 70–99)
Potassium: 3.9 mmol/L (ref 3.5–5.1)
Sodium: 139 mmol/L (ref 135–145)
TOTAL PROTEIN: 6.2 g/dL (ref 6.0–8.3)
Total Bilirubin: 1.5 mg/dL — ABNORMAL HIGH (ref 0.3–1.2)

## 2015-03-27 LAB — GLUCOSE, CAPILLARY
GLUCOSE-CAPILLARY: 87 mg/dL (ref 70–99)
GLUCOSE-CAPILLARY: 82 mg/dL (ref 70–99)
Glucose-Capillary: 98 mg/dL (ref 70–99)
Glucose-Capillary: 84 mg/dL (ref 70–99)

## 2015-03-27 LAB — CBC
HCT: 35.6 % — ABNORMAL LOW (ref 36.0–46.0)
Hemoglobin: 11.3 g/dL — ABNORMAL LOW (ref 12.0–15.0)
MCH: 27.3 pg (ref 26.0–34.0)
MCHC: 31.7 g/dL (ref 30.0–36.0)
MCV: 86 fL (ref 78.0–100.0)
PLATELETS: 339 10*3/uL (ref 150–400)
RBC: 4.14 MIL/uL (ref 3.87–5.11)
RDW: 14 % (ref 11.5–15.5)
WBC: 4 10*3/uL (ref 4.0–10.5)

## 2015-03-27 MED ORDER — BISACODYL 10 MG RE SUPP: 10.0000 mg | Freq: Once | RECTAL | Status: DC

## 2015-03-27 MED ORDER — DOCUSATE SODIUM 100 MG PO CAPS: 100.0000 mg | ORAL_CAPSULE | Freq: Two times a day (BID) | ORAL | Status: AC

## 2015-03-27 NOTE — Progress Notes (Signed)
NURSING PROGRESS NOTE  Carrie Larson [id number] Discharge Data: 03/27/2015 4:59 PM Attending Provider: Richarda Overlie, MD XNA:[id number], MD     [name] to be D/C'd Home per MD order.  Discussed with the patient the After Visit Summary and all questions fully answered with english speaking family in room. All IV's discontinued with no bleeding noted. All belongings returned to patient for patient to take home.   Last Vital Signs:  Blood pressure 114/56, pulse 64, temperature 98.2 F (36.8 C), temperature source Oral, resp. rate 14, height  (1.702 m), weight 77.066 kg (169 lb 14.4 oz), SpO2 100 %.  Discharge Medication List   Medication List    STOP taking these medications        captopril 12.5 MG tablet  Commonly known as:  CAPOTEN      TAKE these medications        docusate sodium 100 MG capsule  Commonly known as:  COLACE  Take 1 capsule (100 mg total) by mouth 2 (two) times daily.     fluticasone 50 MCG/ACT nasal spray  Commonly known as:  FLONASE  Place 2 sprays into both nostrils daily as needed for allergies or rhinitis.     lisinopril 20 MG tablet  Commonly known as:  PRINIVIL,ZESTRIL  Take 0.5 tablets (10 mg total) by mouth daily.     metFORMIN 500 MG tablet  Commonly known as:  GLUCOPHAGE  Take 500 mg by mouth 2 (two) times daily with a meal.     pantoprazole 40 MG tablet  Commonly known as:  PROTONIX  Take 1 tablet by mouth daily.         Cathlyn Parsons, RN

## 2015-03-27 NOTE — Discharge Summary (Addendum)
Physician Discharge Summary  Carrie Larson MRN: [id number] DOB/AGE: 53-Jul-1962 53 y.o.  PCP: Benito Mccreedy, MD   Admit date: 03/26/2015 Discharge date: 03/27/2015  Discharge Diagnoses:     Active Problems:   SBO (small bowel obstruction)  Discharge Diagnoses:  Active Hospital Problems   Diagnosis Date Noted  . SBO (small bowel obstruction) 03/08/2015  . Diabetes mellitus type 2, noninsulin dependent   . Small bowel obstruction 03/08/2015  . Type 2 diabetes mellitus 03/08/2015  . Essential hypertension 03/08/2015  . UTI (urinary tract infection) 03/08/2015           Recommendations for Outpatient Follow-up:   F/u with PMD in one week to repeat bmp, to f/u on a1c result F/u with GI to ensure resolution of SBO, repeat Ct ab/SBFT/colonoscopy per primary GI's discretion.    Medication List    STOP taking these medications        captopril 12.5 MG tablet  Commonly known as:  CAPOTEN      TAKE these medications        fluticasone 50 MCG/ACT nasal spray  Commonly known as:  FLONASE  Place 2 sprays into both nostrils daily as needed for allergies or rhinitis.     lisinopril 20 MG tablet  Commonly known as:  PRINIVIL,ZESTRIL  Take 0.5 tablets (10 mg total) by mouth daily.     metFORMIN 500 MG tablet  Commonly known as:  GLUCOPHAGE  Take 500 mg by mouth 2 (two) times daily with a meal.     pantoprazole 40 MG tablet  Commonly known as:  PROTONIX  Take 1 tablet by mouth daily.        Discharge Condition: Stable   Disposition: 01-Home or Self Care   Consults:  None  Significant Diagnostic Studies: Ct Abdomen Pelvis W Contrast  03/08/2015   CLINICAL DATA:  Acute onset of mid abdominal pain, nausea and vomiting. Initial encounter.  EXAM: CT ABDOMEN AND PELVIS WITH CONTRAST  TECHNIQUE: Multidetector CT imaging of the abdomen and pelvis was performed using the standard protocol following bolus administration of intravenous  contrast.  CONTRAST:  77m OMNIPAQUE IOHEXOL 300 MG/ML SOLN, 1068mOMNIPAQUE IOHEXOL 300 MG/ML SOLN  COMPARISON:  Abdominal radiograph performed earlier today at 12:15 a.m.  FINDINGS: Minimal bibasilar atelectasis is noted.  The liver and spleen are unremarkable in appearance. The patient is status post cholecystectomy. The pancreas and adrenal glands are unremarkable.  The kidneys are unremarkable in appearance. There is no evidence of hydronephrosis. No renal or ureteral stones are seen. No perinephric stranding is appreciated.  There is diffuse dilatation of small bowel loops throughout the abdomen and upper pelvis, measuring up to 4.4 cm in maximal diameter. This appears to reflect focal narrowing of the small bowel just distal to the dilated mid to distal ileum, with mildly increased wall enhancement and surrounding soft tissue inflammation.  Several additional segments of increased wall enhancement and soft tissue inflammation are seen along the mid and distal ileum, both proximal and distal to the site of obstruction. This raises suspicion for an underlying inflammatory small bowel process. Acute infectious ileitis or malignancy are considered less likely. There is no definite evidence for adhesion.  Trace free fluid is seen at the right mid abdomen, and tracking adjacent to small bowel loops. A small amount of free fluid is noted tracking along the left paracolic gutter, into the pelvis.  The stomach is within normal limits. No acute vascular abnormalities are seen.  The appendix is normal  in caliber, without evidence for appendicitis. The colon is largely decompressed and grossly unremarkable in appearance.  The bladder is decompressed and not well assessed. The patient is status post hysterectomy. The ovaries are relatively symmetric. No suspicious adnexal masses are seen. No inguinal lymphadenopathy is seen.  No acute osseous abnormalities are identified.  IMPRESSION: 1. Diffuse dilatation of small bowel  loops throughout the abdomen and upper pelvis, compatible with small bowel obstruction. The obstruction appears to reflect focal narrowing of the small bowel just distal to the dilated mid to distal ileum, with mildly increased wall enhancement and surrounding soft tissue inflammation. Several additional segments of focal narrowing, wall enhancement and soft tissue inflammation are seen along the mid and distal ileum, both proximal and distal to the site of obstruction. This raises suspicion for underlying inflammatory small bowel process. Acute infectious ileitis or malignancy are considered less likely. 2. Trace associated free fluid noted tracking about small bowel loops and extending into the pelvis.  These results were called by telephone at the time of interpretation on 03/08/2015 at 2:24 am to Dr. Veatrice Kells, who verbally acknowledged these results.   Electronically Signed   By: Garald Balding M.D.   On: 03/08/2015 02:24   Dg Abd 2 Views  03/09/2015   CLINICAL DATA:  Subsequent evaluation of abdominal discomfort since yesterday with diagnosis of small bowel obstruction  EXAM: ABDOMEN - 2 VIEW  COMPARISON:  03/08/2015  FINDINGS: Dilated loops of bowel in the left abdomen measuring up to 4 cm and demonstrating air-fluid levels. Other nondistended loops of small bowel in the mid abdomen are identified which also demonstrate air-fluid levels. The colon is decompressed with minimal gas. There is no free air. The degree of distention is not significantly different when compared to the prior study.  IMPRESSION: Small bowel obstruction with similar features as compared to prior radiograph.   Electronically Signed   By: Skipper Cliche M.D.   On: 03/09/2015 08:17   Dg Abd Acute W/chest  03/26/2015   CLINICAL DATA:  Generalized abdominal pain with nausea and vomiting.  EXAM: ACUTE ABDOMEN SERIES (ABDOMEN 2 VIEW & CHEST 1 VIEW)  COMPARISON:  None.  FINDINGS: There is abnormal dilatation of small bowel with  air-fluid levels. The colon appears relatively decompressed, with air present throughout. No free intraperitoneal air is evident. No significant cardiopulmonary abnormality is evident on the upright view of the chest.  IMPRESSION: Abnormal small bowel dilatation with air-fluid levels, suspicious for a small bowel obstruction.   Electronically Signed   By: Andreas Newport M.D.   On: 03/26/2015 03:03   Dg Abd Acute W/chest  03/08/2015   CLINICAL DATA:  Acute onset of central abdominal pain, with nausea and vomiting. Initial encounter.  EXAM: ACUTE ABDOMEN SERIES (ABDOMEN 2 VIEW & CHEST 1 VIEW)  COMPARISON:  None.  FINDINGS: The lungs are well-aerated and clear. There is no evidence of focal opacification, pleural effusion or pneumothorax. The cardiomediastinal silhouette is within normal limits.  There is dilatation of small bowel loops to 4.1 cm in maximal diameter at the mid abdomen, raising concern for some degree of bowel obstruction. Associated air-fluid levels are seen. A small amount of stool is still seen within the colon. No free intra-abdominal air is identified on the provided upright view.  No acute osseous abnormalities are seen; the sacroiliac joints are unremarkable in appearance.  IMPRESSION: 1. Dilatation of a small bowel loops to 4.1 cm, with associated air-fluid levels, at the mid abdomen. This  raises concern for some degree of bowel obstruction. CT of the abdomen and pelvis would be helpful for further evaluation. No free intra-abdominal air seen. 2. No acute cardiopulmonary process identified.  These results were called by telephone at the time of interpretation on 03/08/2015 at 12:15 am to Dr. Veatrice Kells, who verbally acknowledged these results.   Electronically Signed   By: Garald Balding M.D.   On: 03/08/2015 00:16   Mm Diag Breast Tomo Bilateral  03/02/2015   CLINICAL DATA:  Patient was called back from screening mammogram for possible masses in each breast.  EXAM: DIGITAL DIAGNOSTIC  BILATERAL MAMMOGRAM WITH 3D TOMOSYNTHESIS AND CAD  COMPARISON:  02/16/2015.  ACR Breast Density Category b: There are scattered areas of fibroglandular density.  FINDINGS: Additional imaging of both breast were obtained. There is no suspicious mass, malignant type microcalcifications or distortion.  Mammographic images were processed with CAD.  IMPRESSION: No evidence of malignancy in either breast.  RECOMMENDATION: Bilateral screening mammogram in 1 year is recommended.  I have discussed the findings and recommendations with the patient. Results were also provided in writing at the conclusion of the visit. If applicable, a reminder letter will be sent to the patient regarding the next appointment.  BI-RADS CATEGORY  1: Negative.   Electronically Signed   By: Lillia Mountain M.D.   On: 03/02/2015 16:45     Microbiology: No results found for this or any previous visit (from the past 240 hour(s)).   Labs: Results for orders placed or performed during the hospital encounter of 03/26/15 (from the past 48 hour(s))  CBC with Differential     Status: Abnormal   Collection Time: 03/26/15 12:13 AM  Result Value Ref Range   WBC 9.7 4.0 - 10.5 K/uL   RBC 4.63 3.87 - 5.11 MIL/uL   Hemoglobin 12.8 12.0 - 15.0 g/dL   HCT 38.7 36.0 - 46.0 %   MCV 83.6 78.0 - 100.0 fL   MCH 27.6 26.0 - 34.0 pg   MCHC 33.1 30.0 - 36.0 g/dL   RDW 13.9 11.5 - 15.5 %   Platelets 404 (H) 150 - 400 K/uL   Neutrophils Relative % 80 (H) 43 - 77 %   Neutro Abs 7.7 1.7 - 7.7 K/uL   Lymphocytes Relative 11 (L) 12 - 46 %   Lymphs Abs 1.1 0.7 - 4.0 K/uL   Monocytes Relative 7 3 - 12 %   Monocytes Absolute 0.7 0.1 - 1.0 K/uL   Eosinophils Relative 2 0 - 5 %   Eosinophils Absolute 0.2 0.0 - 0.7 K/uL   Basophils Relative 0 0 - 1 %   Basophils Absolute 0.0 0.0 - 0.1 K/uL  Comprehensive metabolic panel     Status: Abnormal   Collection Time: 03/26/15 12:13 AM  Result Value Ref Range   Sodium 138 135 - 145 mmol/L   Potassium 4.6 3.5 -  5.1 mmol/L   Chloride 101 96 - 112 mmol/L   CO2 28 19 - 32 mmol/L   Glucose, Bld 156 (H) 70 - 99 mg/dL   BUN 6 6 - 23 mg/dL   Creatinine, Ser 0.72 0.50 - 1.10 mg/dL   Calcium 9.8 8.4 - 10.5 mg/dL   Total Protein 7.8 6.0 - 8.3 g/dL   Albumin 3.6 3.5 - 5.2 g/dL   AST 18 0 - 37 U/L   ALT 21 0 - 35 U/L   Alkaline Phosphatase 101 39 - 117 U/L   Total Bilirubin 1.0 0.3 -  1.2 mg/dL   GFR calc non Af Amer >90 >90 mL/min   GFR calc Af Amer >90 >90 mL/min    Comment: (NOTE) The eGFR has been calculated using the CKD EPI equation. This calculation has not been validated in all clinical situations. eGFR's persistently <90 mL/min signify possible Chronic Kidney Disease.    Anion gap 9 5 - 15  Lipase, blood     Status: None   Collection Time: 03/26/15 12:13 AM  Result Value Ref Range   Lipase 14 11 - 59 U/L  Urinalysis, Routine w reflex microscopic     Status: Abnormal   Collection Time: 03/26/15 12:15 AM  Result Value Ref Range   Color, Urine AMBER (A) YELLOW    Comment: BIOCHEMICALS MAY BE AFFECTED BY COLOR   APPearance CLEAR CLEAR   Specific Gravity, Urine 1.014 1.005 - 1.030   pH 8.0 5.0 - 8.0   Glucose, UA NEGATIVE NEGATIVE mg/dL   Hgb urine dipstick NEGATIVE NEGATIVE   Bilirubin Urine NEGATIVE NEGATIVE   Ketones, ur 15 (A) NEGATIVE mg/dL   Protein, ur NEGATIVE NEGATIVE mg/dL   Urobilinogen, UA 1.0 0.0 - 1.0 mg/dL   Nitrite NEGATIVE NEGATIVE   Leukocytes, UA NEGATIVE NEGATIVE    Comment: MICROSCOPIC NOT DONE ON URINES WITH NEGATIVE PROTEIN, BLOOD, LEUKOCYTES, NITRITE, OR GLUCOSE <1000 mg/dL.  I-stat troponin, ED (only if pt is 53 y.o. or older & pain is above umbilicus) - do not order at [facility]     Status: None   Collection Time: 03/26/15 12:21 AM  Result Value Ref Range   Troponin i, poc 0.00 0.00 - 0.08 ng/mL   Comment 3            Comment: Due to the release kinetics of cTnI, a negative result within the first hours of the onset of symptoms does not rule out myocardial  infarction with certainty. If myocardial infarction is still suspected, repeat the test at appropriate intervals.   CBG monitoring, ED     Status: Abnormal   Collection Time: 03/26/15  3:56 AM  Result Value Ref Range   Glucose-Capillary 123 (H) 70 - 99 mg/dL  Glucose, capillary     Status: Abnormal   Collection Time: 03/26/15  4:57 AM  Result Value Ref Range   Glucose-Capillary 116 (H) 70 - 99 mg/dL   Comment 1 Notify RN    Comment 2 Document in Chart   Basic metabolic panel     Status: Abnormal   Collection Time: 03/26/15  6:45 AM  Result Value Ref Range   Sodium 139 135 - 145 mmol/L   Potassium 4.4 3.5 - 5.1 mmol/L   Chloride 106 96 - 112 mmol/L   CO2 27 19 - 32 mmol/L   Glucose, Bld 100 (H) 70 - 99 mg/dL   BUN <5 (L) 6 - 23 mg/dL   Creatinine, Ser 0.72 0.50 - 1.10 mg/dL   Calcium 9.0 8.4 - 10.5 mg/dL   GFR calc non Af Amer >90 >90 mL/min   GFR calc Af Amer >90 >90 mL/min    Comment: (NOTE) The eGFR has been calculated using the CKD EPI equation. This calculation has not been validated in all clinical situations. eGFR's persistently <90 mL/min signify possible Chronic Kidney Disease.    Anion gap 6 5 - 15  CBC     Status: None   Collection Time: 03/26/15  6:45 AM  Result Value Ref Range   WBC 7.9 4.0 - 10.5 K/uL   RBC  4.29 3.87 - 5.11 MIL/uL   Hemoglobin 12.0 12.0 - 15.0 g/dL   HCT 36.2 36.0 - 46.0 %   MCV 84.4 78.0 - 100.0 fL   MCH 28.0 26.0 - 34.0 pg   MCHC 33.1 30.0 - 36.0 g/dL   RDW 14.1 11.5 - 15.5 %   Platelets 359 150 - 400 K/uL  Glucose, capillary     Status: None   Collection Time: 03/26/15  7:43 AM  Result Value Ref Range   Glucose-Capillary 96 70 - 99 mg/dL  Sedimentation rate     Status: Abnormal   Collection Time: 03/26/15  9:34 AM  Result Value Ref Range   Sed Rate 41 (H) 0 - 22 mm/hr  C-reactive protein     Status: Abnormal   Collection Time: 03/26/15  9:34 AM  Result Value Ref Range   CRP 3.1 (H) <0.60 mg/dL    Comment: Performed at [facility]  Glucose, capillary     Status: None   Collection Time: 03/26/15 11:52 AM  Result Value Ref Range   Glucose-Capillary 80 70 - 99 mg/dL  Glucose, capillary     Status: None   Collection Time: 03/26/15  5:39 PM  Result Value Ref Range   Glucose-Capillary 93 70 - 99 mg/dL  Glucose, capillary     Status: Abnormal   Collection Time: 03/26/15  8:01 PM  Result Value Ref Range   Glucose-Capillary 123 (H) 70 - 99 mg/dL  Glucose, capillary     Status: None   Collection Time: 03/26/15 10:36 PM  Result Value Ref Range   Glucose-Capillary 89 70 - 99 mg/dL  Glucose, capillary     Status: None   Collection Time: 03/27/15 12:02 AM  Result Value Ref Range   Glucose-Capillary 98 70 - 99 mg/dL  Glucose, capillary     Status: None   Collection Time: 03/27/15  4:23 AM  Result Value Ref Range   Glucose-Capillary 87 70 - 99 mg/dL  Glucose, capillary     Status: None   Collection Time: 03/27/15  7:51 AM  Result Value Ref Range   Glucose-Capillary 82 70 - 99 mg/dL     HPI  HPI: Carrie Larson is a 54 y.o. female. Born in [place] does not speak english, understands a little bit. PMH Type 2 DM on Metformin at home. Nasal polyps. S/p hysterectomy, cholecystectomy, C section. Has and umbilical hernia. Colonoscopy in 2015 in [facility].  02/2014 Colonoscopy For screening and for hx of colitis "20 years ago" and colonoscopy in [place] then.  Findings: normal TI. Grossly normal appearing colon. Random biopsies showed no evidence of colitis. Int/ext hemorrhoids noted.   For about 2 months has been having abdominal pain, no specific location and no associated diarrhea, has ~ 2 formed BMs per day. In last 21 months has had 23# weight loss associated with reduced appetite.   On Sunday 3/14 she ate a lot more than usual, including bananas in celebrating her mother's birthday. The next day she had acute abdominal pain and n/v of non-bloody material, BMs were scant to none. Admitted from [facility]  ED 3/15  WBCs within normal limits during this admission at 7.9 Admitted 3/14-3/18 and treated with IV antibiotics,Cipro/Flagyl IV.  Xray shows SBO. Patient admitted again on  4/2 with generalized abdominal pain and associated nausea vomiting flatulence and bloating. No constipation as the patient had a BM on the day of admission. Patient was supposed to follow-up with Dr. Alice Reichert in [facility] who is her primary gastroenterologist after her discharge on 3/18 but never did.  CT scan 3/15 shows obstruction at area of narrowing in area of mid/distal ileum, associated changes in intestinal wall and adjacent soft tissue worrisome for underlying inflammatory process.  C diff is negative. Stool pathogen panel in process.  Surgery has seen pt, favors conservative mgt. Raise ? Of IBD.  Patient evaluated by both GI and general surgery during her last admission  Maternal grandmother and her mom had stomach, intestinal issues but no formal diagnosis of IBD. Her sister's son has been diagnosed with Crohn's.   HOSPITAL COURSE: Partial small bowel obstruction/ileus Patient has tolerated clear liquids and denies any abdominal pain and vomiting Upset that the patient has not received a colonoscopy during this hospitalization Last seen by gastroenterology on 3/17 Dr. Henrene Pastor recommended patient follow-up with her outpatient gastroenterologist Dr. Alice Reichert in 1-2 weeks and he can decide if the patient needs repeat CT imaging, versus post convalescence SBFT, to assess for signs of IBD  Patient not treated with steroids because of listed allergy GI recommended to stop antibiotics during her last admission  Obtaining a repeat KUB today patient clinically improved I feel that inpatient workup is not indicated on an urgent basis Patient is eating , and not nauseous ESR and CRP mildly elevated   Type 2 DM -hod home meds. Monitor on SSI , hemoglobin A1c 6.0  Essential HTN - stable. Resume outpatient  regimen  Discharge Exam:    Blood pressure 101/57, pulse 61, temperature 98.2 F (36.8 C), temperature source Oral, resp. rate 16, height 5' 7" (1.702 m), weight 77.066 kg (169 lb 14.4 oz), SpO2 100 %.  General: NAD  HEENT: no scleral icterus  Cardiovascular: RRR without MRG, good peripheral pulses, no edema  Respiratory: CTA biL, no wheezing, no crackles  Abdomen: non tender, soft, positive bowel sounds  MSK/Extremities: no clubbing/cyanosis  Skin: no rashes  Neuro: non focal        Discharge Instructions    Diet - low sodium heart healthy    Complete by:  As directed      Increase activity slowly    Complete by:  As directed            Follow-up Information    Follow up with T. Madolyn Frieze, MD.   Contact information:      [facility] [place]  [place], [place] [address]  Appointments: [phone]  or [phone]       Follow up with OSEI-BONSU,GEORGE, MD. Schedule an appointment as soon as possible for a visit in 3 days.   Specialty:  Internal Medicine   Contact information:   [address] [place] [address] [phone]       Signed: Reyne Dumas 03/27/2015, 10:44 AM

## 2015-03-28 LAB — HEMOGLOBIN A1C
Hgb A1c MFr Bld: 6.1 % — ABNORMAL HIGH (ref 4.8–5.6)
Mean Plasma Glucose: 128 mg/dL

## 2015-04-19 DIAGNOSIS — E118 Type 2 diabetes mellitus with unspecified complications: Secondary | ICD-10-CM | POA: Insufficient documentation

## 2015-04-19 DIAGNOSIS — R112 Nausea with vomiting, unspecified: Secondary | ICD-10-CM | POA: Insufficient documentation

## 2015-04-19 DIAGNOSIS — R109 Unspecified abdominal pain: Secondary | ICD-10-CM | POA: Insufficient documentation

## 2017-04-12 DIAGNOSIS — Z01818 Encounter for other preprocedural examination: Secondary | ICD-10-CM | POA: Diagnosis not present

## 2017-04-12 DIAGNOSIS — K432 Incisional hernia without obstruction or gangrene: Secondary | ICD-10-CM | POA: Diagnosis not present

## 2017-04-19 DIAGNOSIS — K430 Incisional hernia with obstruction, without gangrene: Secondary | ICD-10-CM | POA: Diagnosis not present

## 2017-04-19 DIAGNOSIS — N736 Female pelvic peritoneal adhesions (postinfective): Secondary | ICD-10-CM | POA: Diagnosis not present

## 2017-04-19 DIAGNOSIS — E119 Type 2 diabetes mellitus without complications: Secondary | ICD-10-CM | POA: Diagnosis not present

## 2017-04-19 DIAGNOSIS — I10 Essential (primary) hypertension: Secondary | ICD-10-CM | POA: Diagnosis not present

## 2017-06-06 DIAGNOSIS — I10 Essential (primary) hypertension: Secondary | ICD-10-CM | POA: Diagnosis not present

## 2017-06-06 DIAGNOSIS — E041 Nontoxic single thyroid nodule: Secondary | ICD-10-CM | POA: Diagnosis not present

## 2017-06-06 DIAGNOSIS — E119 Type 2 diabetes mellitus without complications: Secondary | ICD-10-CM | POA: Diagnosis not present

## 2017-06-06 DIAGNOSIS — E559 Vitamin D deficiency, unspecified: Secondary | ICD-10-CM | POA: Diagnosis not present

## 2017-06-06 DIAGNOSIS — K219 Gastro-esophageal reflux disease without esophagitis: Secondary | ICD-10-CM | POA: Diagnosis not present

## 2017-07-30 DIAGNOSIS — E041 Nontoxic single thyroid nodule: Secondary | ICD-10-CM | POA: Diagnosis not present

## 2017-08-09 DIAGNOSIS — K649 Unspecified hemorrhoids: Secondary | ICD-10-CM | POA: Diagnosis not present

## 2017-08-09 DIAGNOSIS — Z01118 Encounter for examination of ears and hearing with other abnormal findings: Secondary | ICD-10-CM | POA: Diagnosis not present

## 2017-08-09 DIAGNOSIS — I10 Essential (primary) hypertension: Secondary | ICD-10-CM | POA: Diagnosis not present

## 2017-08-09 DIAGNOSIS — K219 Gastro-esophageal reflux disease without esophagitis: Secondary | ICD-10-CM | POA: Diagnosis not present

## 2017-08-09 DIAGNOSIS — E559 Vitamin D deficiency, unspecified: Secondary | ICD-10-CM | POA: Diagnosis not present

## 2017-08-09 DIAGNOSIS — E119 Type 2 diabetes mellitus without complications: Secondary | ICD-10-CM | POA: Diagnosis not present

## 2017-08-09 DIAGNOSIS — Z0000 Encounter for general adult medical examination without abnormal findings: Secondary | ICD-10-CM | POA: Diagnosis not present

## 2017-11-08 DIAGNOSIS — I10 Essential (primary) hypertension: Secondary | ICD-10-CM | POA: Diagnosis not present

## 2017-11-08 DIAGNOSIS — K219 Gastro-esophageal reflux disease without esophagitis: Secondary | ICD-10-CM | POA: Diagnosis not present

## 2017-11-08 DIAGNOSIS — E119 Type 2 diabetes mellitus without complications: Secondary | ICD-10-CM | POA: Diagnosis not present

## 2017-11-08 DIAGNOSIS — K649 Unspecified hemorrhoids: Secondary | ICD-10-CM | POA: Diagnosis not present

## 2017-11-08 DIAGNOSIS — E559 Vitamin D deficiency, unspecified: Secondary | ICD-10-CM | POA: Diagnosis not present

## 2018-01-02 DIAGNOSIS — I10 Essential (primary) hypertension: Secondary | ICD-10-CM | POA: Diagnosis not present

## 2018-01-02 DIAGNOSIS — E785 Hyperlipidemia, unspecified: Secondary | ICD-10-CM | POA: Diagnosis not present

## 2018-01-02 DIAGNOSIS — E559 Vitamin D deficiency, unspecified: Secondary | ICD-10-CM | POA: Diagnosis not present

## 2018-01-02 DIAGNOSIS — E119 Type 2 diabetes mellitus without complications: Secondary | ICD-10-CM | POA: Diagnosis not present

## 2018-03-21 DIAGNOSIS — N814 Uterovaginal prolapse, unspecified: Secondary | ICD-10-CM | POA: Diagnosis not present

## 2018-03-21 DIAGNOSIS — N939 Abnormal uterine and vaginal bleeding, unspecified: Secondary | ICD-10-CM | POA: Diagnosis not present

## 2018-03-21 DIAGNOSIS — E785 Hyperlipidemia, unspecified: Secondary | ICD-10-CM | POA: Diagnosis not present

## 2018-03-21 DIAGNOSIS — E119 Type 2 diabetes mellitus without complications: Secondary | ICD-10-CM | POA: Diagnosis not present

## 2018-04-23 DIAGNOSIS — N814 Uterovaginal prolapse, unspecified: Secondary | ICD-10-CM | POA: Diagnosis not present

## 2018-04-23 DIAGNOSIS — N950 Postmenopausal bleeding: Secondary | ICD-10-CM | POA: Diagnosis not present

## 2018-04-23 DIAGNOSIS — N816 Rectocele: Secondary | ICD-10-CM | POA: Diagnosis not present

## 2018-05-05 DIAGNOSIS — R3121 Asymptomatic microscopic hematuria: Secondary | ICD-10-CM | POA: Diagnosis not present

## 2018-05-05 DIAGNOSIS — E119 Type 2 diabetes mellitus without complications: Secondary | ICD-10-CM | POA: Diagnosis not present

## 2018-05-09 DIAGNOSIS — E559 Vitamin D deficiency, unspecified: Secondary | ICD-10-CM | POA: Diagnosis not present

## 2018-05-09 DIAGNOSIS — E119 Type 2 diabetes mellitus without complications: Secondary | ICD-10-CM | POA: Diagnosis not present

## 2018-05-09 DIAGNOSIS — I10 Essential (primary) hypertension: Secondary | ICD-10-CM | POA: Diagnosis not present

## 2018-05-09 DIAGNOSIS — E785 Hyperlipidemia, unspecified: Secondary | ICD-10-CM | POA: Diagnosis not present

## 2018-05-27 DIAGNOSIS — M542 Cervicalgia: Secondary | ICD-10-CM | POA: Diagnosis not present

## 2018-05-27 DIAGNOSIS — M545 Low back pain: Secondary | ICD-10-CM | POA: Diagnosis not present

## 2018-05-27 DIAGNOSIS — M546 Pain in thoracic spine: Secondary | ICD-10-CM | POA: Diagnosis not present

## 2018-05-29 DIAGNOSIS — R3121 Asymptomatic microscopic hematuria: Secondary | ICD-10-CM | POA: Diagnosis not present

## 2018-05-29 DIAGNOSIS — R3129 Other microscopic hematuria: Secondary | ICD-10-CM | POA: Diagnosis not present

## 2018-05-29 DIAGNOSIS — N281 Cyst of kidney, acquired: Secondary | ICD-10-CM | POA: Diagnosis not present

## 2018-05-30 DIAGNOSIS — N281 Cyst of kidney, acquired: Secondary | ICD-10-CM | POA: Diagnosis not present

## 2018-05-30 DIAGNOSIS — R3121 Asymptomatic microscopic hematuria: Secondary | ICD-10-CM | POA: Diagnosis not present

## 2018-06-06 DIAGNOSIS — E119 Type 2 diabetes mellitus without complications: Secondary | ICD-10-CM | POA: Diagnosis not present

## 2018-06-06 DIAGNOSIS — E785 Hyperlipidemia, unspecified: Secondary | ICD-10-CM | POA: Diagnosis not present

## 2018-06-06 DIAGNOSIS — E559 Vitamin D deficiency, unspecified: Secondary | ICD-10-CM | POA: Diagnosis not present

## 2018-06-06 DIAGNOSIS — I10 Essential (primary) hypertension: Secondary | ICD-10-CM | POA: Diagnosis not present

## 2018-06-09 ENCOUNTER — Encounter: Payer: Self-pay | Admitting: Physical Therapy

## 2018-06-09 ENCOUNTER — Ambulatory Visit: Payer: BLUE CROSS/BLUE SHIELD | Attending: Orthopedic Surgery | Admitting: Physical Therapy

## 2018-06-09 DIAGNOSIS — M62830 Muscle spasm of back: Secondary | ICD-10-CM | POA: Insufficient documentation

## 2018-06-09 DIAGNOSIS — M546 Pain in thoracic spine: Secondary | ICD-10-CM | POA: Diagnosis not present

## 2018-06-09 NOTE — Therapy (Signed)
[address] [place], [place], [address] Phone: [phone]   Fax:  [phone]  Physical Therapy Evaluation  Patient Details  Name: Carrie Larson MRN: [id number] Date of Birth: [birth year] Referring Provider: Lynann Bologna   Encounter Date: 06/09/2018  PT End of Session - 06/09/18 1703    Visit Number  1    Number of Visits  30    Date for PT Re-Evaluation  08/09/18    PT Start Time  1622    PT Stop Time  1720    PT Time Calculation (min)  58 min    Activity Tolerance  Patient tolerated treatment well    Behavior During Therapy  [facility] for tasks assessed/performed       Past Medical History:  Diagnosis Date  . Arthritis   . Arthritis 02/2014   hips, elbows, shoulders.   . Complication of anesthesia    " a little anesthesia has a big affect "  . Diabetes mellitus without complication ([place])   . Environmental and seasonal allergies    cats & dogs also  . Hypertension   . Nasal polyps   . Small bowel obstruction ([place]) 02/2015    Past Surgical History:  Procedure Laterality Date  . ABDOMINAL HYSTERECTOMY    . CESAREAN SECTION    . CHOLECYSTECTOMY    . CHOLECYSTECTOMY     open.   . COLONOSCOPY  02/2014   Dr Alice Reichert at [place], [place].  normal TI and colon except for int/ext hemorrhoids.  Random biopsies negative.   . DEBRIDEMENT OF ABDOMINAL WALL ABSCESS    . galactocele     left breast    There were no vitals filed for this visit.   Subjective Assessment - 06/09/18 1628    Subjective  Patient reports about 4 years of upper back pain, reports she stands at her job and has to bend over, she does mention large breasts.  Denies neck pain     Limitations  Standing;Lifting;House hold activities    Patient Stated Goals  have less pain    Currently in Pain?  Yes    Pain Score  5     Pain Location  Thoracic    Pain Orientation  Mid;Upper    Pain Descriptors / Indicators   Constant;Aching;Spasm;Tightness    Pain Type  Chronic pain    Pain Radiating Towards  reports at times she has numbness and tingling in her hands    Pain Onset  More than a month ago    Pain Frequency  Constant    Aggravating Factors   work, standing and bending, reports weather can cause pain to be worse, pain up to 8-9/10    Pain Relieving Factors  rest, lay down, lean back at best pain a 4/10    Effect of Pain on Daily Activities  limits the amount of work         [facility] PT Assessment - 06/09/18 0001      Assessment   Medical Diagnosis  thoracic pain    Referring Provider  Voy[place]    Onset Date/Surgical Date  05/09/18    Hand Dominance  Right    Prior Therapy  in [place] 5 years ago      Precautions   Precautions  None      Balance Screen   Has the patient fallen in the past 6 months  No    Has the patient had a decrease in  activity level because of a fear of falling?   No    Is the patient reluctant to leave their home because of a fear of falling?   No      Home Environment   Additional Comments  does housework,      Prior Function   Level of Independence  Independent    Vocation  Full time employment    Vocation Requirements  light lifting, bending over, repetetive    Leisure  no exercise      Posture/Postural Control   Posture Comments  fwd head, rounded shoulder      ROM / Strength   AROM / PROM / Strength  AROM;Strength      AROM   Overall AROM Comments  cervical ROM was decreased 25% with pain, shoulder ROM was WFL's but with pain in the thoracic area                Objective measurements completed on examination: See above findings.              PT Education - 06/09/18 1702    Education Details  shrugs, retractions with tband    Person(s) Educated  Patient    Methods  Explanation;Demonstration;Handout    Comprehension  Verbalized understanding       PT Short Term Goals - 06/09/18 1706      PT SHORT TERM GOAL #1   Title   independent with initial HEP    Time  2    Period  Weeks    Status  New        PT Long Term Goals - 06/09/18 1706      PT LONG TERM GOAL #1   Title  understand proper posture and body mechanics    Time  8    Period  Weeks    Status  New      PT LONG TERM GOAL #2   Title  report pain decreased 50%    Time  8    Period  Weeks    Status  New      PT LONG TERM GOAL #3   Title  increase lumbar and cervical ROM to WNL's    Time  8    Period  Weeks    Status  New             Plan - 06/09/18 1703    Clinical Impression Statement  Patient reports some upper back pain for many years, she reports having PT in [place] until about 5 years ago.  She reports that she has mid to upper back pain, she mentions that she thinks it is part the job she does and the large breasts.  Her cervical ROM and lumbar ROM were decreased slightly, she has significant spasms in the upper traps and the rhomboids, reports at times has some tingling in the hands.    Clinical Presentation  Stable    Clinical Decision Making  Low    Rehab Potential  Good    PT Frequency  2x / week    PT Duration  8 weeks    PT Treatment/Interventions  ADLs/Self Care Home Management;Electrical Stimulation;Moist Heat;Traction;Ultrasound;Therapeutic exercise;Therapeutic activities;Patient/family education;Manual techniques    PT Next Visit Plan  patient is concerned that she has not met her deductible and may not return or may try 1-2 visits    Consulted and Agree with Plan of Care  Patient       Patient will benefit from  skilled therapeutic intervention in order to improve the following deficits and impairments:  Decreased range of motion, Increased muscle spasms, Pain, Improper body mechanics, Decreased strength, Postural dysfunction  Visit Diagnosis: Pain in thoracic spine - Plan: PT plan of care cert/re-cert  Muscle spasm of back - Plan: PT plan of care cert/re-cert     Problem List Patient Active Problem List    Diagnosis Date Noted  . Abdominal pain   . Nausea with vomiting   . Type 2 diabetes mellitus with complication ([place])   . Diabetes mellitus type 2, noninsulin dependent ([place])   . SBO (small bowel obstruction) ([place]) 03/08/2015  . Small bowel obstruction ([place]) 03/08/2015  . Type 2 diabetes mellitus ([place]) 03/08/2015  . Essential hypertension 03/08/2015  . UTI (urinary tract infection) 03/08/2015    Sumner Boast., PT 06/09/2018, 5:08 PM  [address] [place], [place], [address] Phone: [phone]   Fax:  [phone]  Name: Carrie Larson MRN: [id number] Date of Birth: February 08, 1961

## 2018-06-19 ENCOUNTER — Ambulatory Visit: Payer: BLUE CROSS/BLUE SHIELD | Admitting: Physical Therapy

## 2018-06-19 DIAGNOSIS — M546 Pain in thoracic spine: Secondary | ICD-10-CM | POA: Diagnosis not present

## 2018-06-19 DIAGNOSIS — M62830 Muscle spasm of back: Secondary | ICD-10-CM | POA: Diagnosis not present

## 2018-06-19 NOTE — Therapy (Signed)
[place] [address] [place], [place], [address] Phone: [phone]   Fax:  [phone]  Physical Therapy Treatment  Patient Details  Name: Carrie Larson MRN: [id number] Date of Birth: [birth year] Referring Provider: Lynann Bologna   Encounter Date: 06/19/2018  PT End of Session - 06/19/18 1608    Visit Number  2    Number of Visits  30    Date for PT Re-Evaluation  08/09/18    PT Start Time  9024    PT Stop Time  1630    PT Time Calculation (min)  48 min       Past Medical History:  Diagnosis Date  . Arthritis   . Arthritis 02/2014   hips, elbows, shoulders.   . Complication of anesthesia    " a little anesthesia has a big affect "  . Diabetes mellitus without complication ([place])   . Environmental and seasonal allergies    cats & dogs also  . Hypertension   . Nasal polyps   . Small bowel obstruction ([place]) 02/2015    Past Surgical History:  Procedure Laterality Date  . ABDOMINAL HYSTERECTOMY    . CESAREAN SECTION    . CHOLECYSTECTOMY    . CHOLECYSTECTOMY     open.   . COLONOSCOPY  02/2014   Dr Alice Reichert at [place], [place].  normal TI and colon except for int/ext hemorrhoids.  Random biopsies negative.   . DEBRIDEMENT OF ABDOMINAL WALL ABSCESS    . galactocele     left breast    There were no vitals filed for this visit.  Subjective Assessment - 06/19/18 1542    Subjective  constant pain much better, cramps and spasms ( interpreter 12 min late)    Patient is accompained by:  Interpreter    Currently in Pain?  Yes    Pain Score  4     Pain Location  Thoracic    Pain Orientation  Mid                       [facility] Adult PT Treatment/Exercise - 06/19/18 0001      Exercises   Exercises  Lumbar      Lumbar Exercises: Aerobic   UBE (Upper Arm Bike)  L 3 2 fwd/2back      Lumbar Exercises: Machines for Strengthening   Cybex Lumbar Extension  black tband 2 sets 10    Other Lumbar  Machine Exercise  row and lats 20# 2 sets 10      Lumbar Exercises: Standing   Other Standing Lumbar Exercises  red tband scap stab 3 way 15 reps      Modalities   Modalities  Electrical Stimulation;Moist Heat      Moist Heat Therapy   Number Minutes Moist Heat  15 Minutes    Moist Heat Location  Lumbar Spine      Electrical Stimulation   Electrical Stimulation Location  thoracic    Electrical Stimulation Action  IFC    Electrical Stimulation Parameters  supine    Electrical Stimulation Goals  Pain             PT Education - 06/19/18 1608    Education Details  added to scap stab ex with tband, trunk ext and issued info for ordering TENS    Person(s) Educated  Patient;Other (comment)    Methods  Explanation;Demonstration;Handout    Comprehension  Verbalized understanding;Returned demonstration  PT Short Term Goals - 06/19/18 1609      PT SHORT TERM GOAL #1   Title  independent with initial HEP    Status  Achieved        PT Long Term Goals - 06/09/18 1706      PT LONG TERM GOAL #1   Title  understand proper posture and body mechanics    Time  8    Period  Weeks    Status  New      PT LONG TERM GOAL #2   Title  report pain decreased 50%    Time  8    Period  Weeks    Status  New      PT LONG TERM GOAL #3   Title  increase lumbar and cervical ROM to WNL's    Time  8    Period  Weeks    Status  New            Plan - 06/19/18 1609    Clinical Impression Statement  STG met. added to HEP and TENS info given. tolerated ther ex well today with postural cuing needed    PT Treatment/Interventions  ADLs/Self Care Home Management;Electrical Stimulation;Moist Heat;Traction;Ultrasound;Therapeutic exercise;Therapeutic activities;Patient/family education;Manual techniques    PT Next Visit Plan  pt to bring in TENS next session.,if doing well may hold or D/C d/t copay/deductible       Patient will benefit from skilled therapeutic intervention in order to  improve the following deficits and impairments:  Decreased range of motion, Increased muscle spasms, Pain, Improper body mechanics, Decreased strength, Postural dysfunction  Visit Diagnosis: Pain in thoracic spine  Muscle spasm of back     Problem List Patient Active Problem List   Diagnosis Date Noted  . Abdominal pain   . Nausea with vomiting   . Type 2 diabetes mellitus with complication ([place])   . Diabetes mellitus type 2, noninsulin dependent ([place])   . SBO (small bowel obstruction) ([place]) 03/08/2015  . Small bowel obstruction ([place]) 03/08/2015  . Type 2 diabetes mellitus ([place]) 03/08/2015  . Essential hypertension 03/08/2015  . UTI (urinary tract infection) 03/08/2015    Omarius Grantham,ANGIE  PTA 06/19/2018, 4:10 PM  [address] [place], [place], [address] Phone: [phone]   Fax:  [phone]  Name: Natalie Mceuen MRN: [id number] Date of Birth: October 14, 1961

## 2018-07-01 ENCOUNTER — Ambulatory Visit: Payer: BLUE CROSS/BLUE SHIELD | Attending: Orthopedic Surgery | Admitting: Physical Therapy

## 2018-07-01 ENCOUNTER — Encounter: Payer: Self-pay | Admitting: Physical Therapy

## 2018-07-01 DIAGNOSIS — M62830 Muscle spasm of back: Secondary | ICD-10-CM

## 2018-07-01 DIAGNOSIS — M546 Pain in thoracic spine: Secondary | ICD-10-CM | POA: Insufficient documentation

## 2018-07-01 NOTE — Therapy (Signed)
[facility] [address] [place], [place], [address] Phone: [phone]   Fax:  [phone]  Physical Therapy Treatment  Patient Details  Name: Carrie Larson MRN: [id number] Date of Birth: 1961-10-30 Referring Provider: Charlett Blake   Encounter Date: 07/01/2018  PT End of Session - 07/01/18 1549    Visit Number  3    Number of Visits  30    Date for PT Re-Evaluation  08/09/18    PT Start Time  1510    PT Stop Time  1605    PT Time Calculation (min)  55 min    Activity Tolerance  Patient tolerated treatment well    Behavior During Therapy  [facility] for tasks assessed/performed       Past Medical History:  Diagnosis Date  . Arthritis   . Arthritis 02/2014   hips, elbows, shoulders.   . Complication of anesthesia    " a little anesthesia has a big affect "  . Diabetes mellitus without complication ([facility])   . Environmental and seasonal allergies    cats & dogs also  . Hypertension   . Nasal polyps   . Small bowel obstruction ([facility]) 02/2015    Past Surgical History:  Procedure Laterality Date  . ABDOMINAL HYSTERECTOMY    . CESAREAN SECTION    . CHOLECYSTECTOMY    . CHOLECYSTECTOMY     open.   . COLONOSCOPY  02/2014   Dr Norma Fredrickson at [place], [place].  normal TI and colon except for int/ext hemorrhoids.  Random biopsies negative.   . DEBRIDEMENT OF ABDOMINAL WALL ABSCESS    . galactocele     left breast    There were no vitals filed for this visit.  Subjective Assessment - 07/01/18 1509    Subjective  pt reports fatigue from working all day. Stated that's he has some pain and discomfort in her shoulders.    Currently in Pain?  Yes    Pain Score  4     Pain Location  Shoulder    Pain Orientation  Left;Right                       [facility] Adult PT Treatment/Exercise - 07/01/18 0001      Lumbar Exercises: Aerobic   UBE (Upper Arm Bike)  L 3 2 fwd/2back      Lumbar Exercises: Machines for  Strengthening   Other Lumbar Machine Exercise  20lb rows 2x10, lats 20lb 3x5      Lumbar Exercises: Standing   Row  20 reps;Theraband;Both    Theraband Level (Row)  Level 1 (Yellow)    Shoulder Extension  Theraband;20 reps;Strengthening    Theraband Level (Shoulder Extension)  Level 1 (Yellow)    Other Standing Lumbar Exercises  shrugs 4lb 2x10     Other Standing Lumbar Exercises  yellow ER 2x10; horizontal abd 2x10 yellow       Lumbar Exercises: Seated   Sit to Stand  10 reps;5 reps forward press with yellow ball       Modalities   Modalities  Electrical Stimulation;Moist Heat      Moist Heat Therapy   Number Minutes Moist Heat  15 Minutes    Moist Heat Location  Lumbar Spine      Electrical Stimulation   Electrical Stimulation Location  thoracic    Electrical Stimulation Action  IFC    Electrical Stimulation Parameters  supine    Electrical Stimulation Goals  Pain               PT Short Term Goals - 06/19/18 1609      PT SHORT TERM GOAL #1   Title  independent with initial HEP    Status  Achieved        PT Long Term Goals - 06/09/18 1706      PT LONG TERM GOAL #1   Title  understand proper posture and body mechanics    Time  8    Period  Weeks    Status  New      PT LONG TERM GOAL #2   Title  report pain decreased 50%    Time  8    Period  Weeks    Status  New      PT LONG TERM GOAL #3   Title  increase lumbar and cervical ROM to WNL's    Time  8    Period  Weeks    Status  New            Plan - 07/01/18 1549    Clinical Impression Statement  Pt reports increase fatigue from working all day. She was able to complete all interventions with light resistance. She reports an improvement overall with decrease pain.     Rehab Potential  Good    PT Frequency  2x / week    PT Duration  8 weeks    PT Treatment/Interventions  ADLs/Self Care Home Management;Electrical Stimulation;Moist Heat;Traction;Ultrasound;Therapeutic exercise;Therapeutic  activities;Patient/family education;Manual techniques    PT Next Visit Plan  pt has not gotten TENs, postural strengthening.        Patient will benefit from skilled therapeutic intervention in order to improve the following deficits and impairments:  Decreased range of motion, Increased muscle spasms, Pain, Improper body mechanics, Decreased strength, Postural dysfunction  Visit Diagnosis: Pain in thoracic spine  Muscle spasm of back     Problem List Patient Active Problem List   Diagnosis Date Noted  . Abdominal pain   . Nausea with vomiting   . Type 2 diabetes mellitus with complication ([facility])   . Diabetes mellitus type 2, noninsulin dependent ([facility])   . SBO (small bowel obstruction) ([facility]) 03/08/2015  . Small bowel obstruction ([facility]) 03/08/2015  . Type 2 diabetes mellitus ([facility]) 03/08/2015  . Essential hypertension 03/08/2015  . UTI (urinary tract infection) 03/08/2015    Grayce Sessions, PTA 07/01/2018, 3:52 PM  [facility] [address] [place], [place], [address] Phone: [phone]   Fax:  [phone]  Name: Willo Yoon MRN: [id number] Date of Birth: [birth year]

## 2018-07-08 DIAGNOSIS — M5134 Other intervertebral disc degeneration, thoracic region: Secondary | ICD-10-CM | POA: Diagnosis not present

## 2018-07-18 DIAGNOSIS — E559 Vitamin D deficiency, unspecified: Secondary | ICD-10-CM | POA: Diagnosis not present

## 2018-07-18 DIAGNOSIS — I10 Essential (primary) hypertension: Secondary | ICD-10-CM | POA: Diagnosis not present

## 2018-07-18 DIAGNOSIS — E119 Type 2 diabetes mellitus without complications: Secondary | ICD-10-CM | POA: Diagnosis not present

## 2018-07-18 DIAGNOSIS — E785 Hyperlipidemia, unspecified: Secondary | ICD-10-CM | POA: Diagnosis not present

## 2018-07-22 ENCOUNTER — Encounter: Payer: Self-pay | Admitting: Physical Therapy

## 2018-07-31 ENCOUNTER — Encounter: Payer: Self-pay | Admitting: Physical Therapy

## 2018-07-31 ENCOUNTER — Ambulatory Visit: Payer: BLUE CROSS/BLUE SHIELD | Attending: Orthopedic Surgery | Admitting: Physical Therapy

## 2018-07-31 DIAGNOSIS — M62830 Muscle spasm of back: Secondary | ICD-10-CM | POA: Diagnosis not present

## 2018-07-31 DIAGNOSIS — M546 Pain in thoracic spine: Secondary | ICD-10-CM | POA: Diagnosis not present

## 2018-07-31 NOTE — Therapy (Signed)
[address] [place], [place], [address] Phone: [phone]   Fax:  [phone]  Physical Therapy Treatment  Patient Details  Name: Carrie Larson MRN: [id number] Date of Birth: Mar 27, 1961 Referring Provider: Lynann Bologna   Encounter Date: 07/31/2018  PT End of Session - 07/31/18 1729    Visit Number  4    Number of Visits  30    Date for PT Re-Evaluation  08/09/18    PT Start Time  1600    PT Stop Time  4235    PT Time Calculation (min)  75 min       Past Medical History:  Diagnosis Date  . Arthritis   . Arthritis 02/2014   hips, elbows, shoulders.   . Complication of anesthesia    " a little anesthesia has a big affect "  . Diabetes mellitus without complication ([place])   . Environmental and seasonal allergies    cats & dogs also  . Hypertension   . Nasal polyps   . Small bowel obstruction ([place]) 02/2015    Past Surgical History:  Procedure Laterality Date  . ABDOMINAL HYSTERECTOMY    . CESAREAN SECTION    . CHOLECYSTECTOMY    . CHOLECYSTECTOMY     open.   . COLONOSCOPY  02/2014   Dr Alice Reichert at [place], [place].  normal TI and colon except for int/ext hemorrhoids.  Random biopsies negative.   . DEBRIDEMENT OF ABDOMINAL WALL ABSCESS    . galactocele     left breast    There were no vitals filed for this visit.  Subjective Assessment - 07/31/18 1608    Subjective   no better with pain. pt verb not here for 1 month d/t no money for copay. pt did arrive with TENS unit she purchased     Patient is accompained by:  Interpreter    Currently in Pain?  Yes    Pain Score  5     Pain Location  Shoulder    Pain Orientation  Right;Left         [facility] PT Assessment - 07/31/18 0001      AROM   Overall AROM Comments  cerv ROM WNLS ext ext decreased 25%, lumbar ROM WNLS except flex decreased 50%                   [facility] Adult PT Treatment/Exercise - 07/31/18 0001      Self-Care   Self-Care  Other Self-Care Comments   TENS use,application and safety.     Exercises   Exercises  Lumbar      Lumbar Exercises: Aerobic   UBE (Upper Arm Bike)  L 3 3 fwd/3back      Lumbar Exercises: Machines for Strengthening   Other Lumbar Machine Exercise  20lb rows 2x10      Lumbar Exercises: Standing   Other Standing Lumbar Exercises  shrugsand backward rolls 4lb 15     Other Standing Lumbar Exercises  postural wall angles 10       Lumbar Exercises: Seated   Other Seated Lumbar Exercises  2# row,ext and abd 2 set s10      Modalities   Modalities  Electrical Stimulation;Moist Heat      Moist Heat Therapy   Number Minutes Moist Heat  15 Minutes    Moist Heat Location  --   thoracic     Electrical Stimulation   Electrical Stimulation Location  thoracic  Electrical Stimulation Action  IFC    Electrical Stimulation Parameters  supine    Electrical Stimulation Goals  Pain             PT Education - 07/31/18 1728    Education Details  TENS - use,application and safety    Person(s) Educated  Patient    Methods  Demonstration;Explanation    Comprehension  Verbalized understanding;Returned demonstration       PT Short Term Goals - 06/19/18 1609      PT SHORT TERM GOAL #1   Title  independent with initial HEP    Status  Achieved        PT Long Term Goals - 07/31/18 1729      PT LONG TERM GOAL #1   Title  understand proper posture and body mechanics    Status  Partially Met      PT LONG TERM GOAL #2   Title  report pain decreased 50%    Status  On-going      PT LONG TERM GOAL #3   Title  increase lumbar and cervical ROM to WNL's    Status  Partially Met            Plan - 07/31/18 1729    Clinical Impression Statement  pt very fatigiued with ex and some increased pain esp when cued for correct posture. Pt verb doing HEP and wishes she could do PT more regularly but copay is so high. Progressing towards goals. Pt educ on TENS and abel to demo  and verb correct use,safety and application.    PT Treatment/Interventions  ADLs/Self Care Home Management;Electrical Stimulation;Moist Heat;Traction;Ultrasound;Therapeutic exercise;Therapeutic activities;Patient/family education;Manual techniques    PT Next Visit Plan  pt to call or stop in with son who speaks english after 8/13 MD appt       Patient will benefit from skilled therapeutic intervention in order to improve the following deficits and impairments:  Decreased range of motion, Increased muscle spasms, Pain, Improper body mechanics, Decreased strength, Postural dysfunction  Visit Diagnosis: Pain in thoracic spine  Muscle spasm of back     Problem List Patient Active Problem List   Diagnosis Date Noted  . Abdominal pain   . Nausea with vomiting   . Type 2 diabetes mellitus with complication ([place])   . Diabetes mellitus type 2, noninsulin dependent ([place])   . SBO (small bowel obstruction) ([place]) 03/08/2015  . Small bowel obstruction ([place]) 03/08/2015  . Type 2 diabetes mellitus ([place]) 03/08/2015  . Essential hypertension 03/08/2015  . UTI (urinary tract infection) 03/08/2015    Kairee Isa,ANGIE PTA 07/31/2018, 5:32 PM  [address] [place], [place], [address] Phone: [phone]   Fax:  [phone]  Name: Carrie Larson MRN: [id number] Date of Birth: 29-Nov-1961

## 2018-08-05 DIAGNOSIS — M5134 Other intervertebral disc degeneration, thoracic region: Secondary | ICD-10-CM | POA: Diagnosis not present

## 2018-10-17 DIAGNOSIS — E785 Hyperlipidemia, unspecified: Secondary | ICD-10-CM | POA: Diagnosis not present

## 2018-10-17 DIAGNOSIS — I10 Essential (primary) hypertension: Secondary | ICD-10-CM | POA: Diagnosis not present

## 2018-10-17 DIAGNOSIS — E559 Vitamin D deficiency, unspecified: Secondary | ICD-10-CM | POA: Diagnosis not present

## 2018-10-17 DIAGNOSIS — E119 Type 2 diabetes mellitus without complications: Secondary | ICD-10-CM | POA: Diagnosis not present

## 2018-10-17 DIAGNOSIS — Z23 Encounter for immunization: Secondary | ICD-10-CM | POA: Diagnosis not present

## 2018-11-07 DIAGNOSIS — E785 Hyperlipidemia, unspecified: Secondary | ICD-10-CM | POA: Diagnosis not present

## 2018-11-07 DIAGNOSIS — E119 Type 2 diabetes mellitus without complications: Secondary | ICD-10-CM | POA: Diagnosis not present

## 2018-11-07 DIAGNOSIS — Z124 Encounter for screening for malignant neoplasm of cervix: Secondary | ICD-10-CM | POA: Diagnosis not present

## 2018-11-07 DIAGNOSIS — E559 Vitamin D deficiency, unspecified: Secondary | ICD-10-CM | POA: Diagnosis not present

## 2018-11-07 DIAGNOSIS — R87610 Atypical squamous cells of undetermined significance on cytologic smear of cervix (ASC-US): Secondary | ICD-10-CM | POA: Diagnosis not present

## 2018-11-07 DIAGNOSIS — I10 Essential (primary) hypertension: Secondary | ICD-10-CM | POA: Diagnosis not present

## 2018-12-03 DIAGNOSIS — E119 Type 2 diabetes mellitus without complications: Secondary | ICD-10-CM | POA: Diagnosis not present

## 2018-12-03 DIAGNOSIS — E785 Hyperlipidemia, unspecified: Secondary | ICD-10-CM | POA: Diagnosis not present

## 2018-12-03 DIAGNOSIS — I10 Essential (primary) hypertension: Secondary | ICD-10-CM | POA: Diagnosis not present

## 2018-12-03 DIAGNOSIS — E559 Vitamin D deficiency, unspecified: Secondary | ICD-10-CM | POA: Diagnosis not present

## 2019-02-11 DIAGNOSIS — E119 Type 2 diabetes mellitus without complications: Secondary | ICD-10-CM | POA: Diagnosis not present

## 2019-02-11 DIAGNOSIS — E559 Vitamin D deficiency, unspecified: Secondary | ICD-10-CM | POA: Diagnosis not present

## 2019-02-11 DIAGNOSIS — E785 Hyperlipidemia, unspecified: Secondary | ICD-10-CM | POA: Diagnosis not present

## 2019-02-11 DIAGNOSIS — I10 Essential (primary) hypertension: Secondary | ICD-10-CM | POA: Diagnosis not present

## 2019-04-28 DIAGNOSIS — R300 Dysuria: Secondary | ICD-10-CM | POA: Diagnosis not present

## 2019-04-28 DIAGNOSIS — Z90710 Acquired absence of both cervix and uterus: Secondary | ICD-10-CM | POA: Diagnosis not present

## 2019-04-28 DIAGNOSIS — Z1272 Encounter for screening for malignant neoplasm of vagina: Secondary | ICD-10-CM | POA: Diagnosis not present

## 2019-04-28 DIAGNOSIS — K644 Residual hemorrhoidal skin tags: Secondary | ICD-10-CM | POA: Diagnosis not present

## 2019-04-28 DIAGNOSIS — K50111 Crohn's disease of large intestine with rectal bleeding: Secondary | ICD-10-CM | POA: Diagnosis not present

## 2019-04-28 DIAGNOSIS — K529 Noninfective gastroenteritis and colitis, unspecified: Secondary | ICD-10-CM | POA: Diagnosis not present

## 2019-04-28 DIAGNOSIS — K625 Hemorrhage of anus and rectum: Secondary | ICD-10-CM | POA: Diagnosis not present

## 2019-05-12 DIAGNOSIS — Z1329 Encounter for screening for other suspected endocrine disorder: Secondary | ICD-10-CM | POA: Diagnosis not present

## 2019-05-12 DIAGNOSIS — Z136 Encounter for screening for cardiovascular disorders: Secondary | ICD-10-CM | POA: Diagnosis not present

## 2019-05-12 DIAGNOSIS — K219 Gastro-esophageal reflux disease without esophagitis: Secondary | ICD-10-CM | POA: Diagnosis not present

## 2019-05-12 DIAGNOSIS — E559 Vitamin D deficiency, unspecified: Secondary | ICD-10-CM | POA: Diagnosis not present

## 2019-05-12 DIAGNOSIS — I10 Essential (primary) hypertension: Secondary | ICD-10-CM | POA: Diagnosis not present

## 2019-05-12 DIAGNOSIS — E119 Type 2 diabetes mellitus without complications: Secondary | ICD-10-CM | POA: Diagnosis not present

## 2019-05-12 DIAGNOSIS — Z01021 Encounter for examination of eyes and vision following failed vision screening with abnormal findings: Secondary | ICD-10-CM | POA: Diagnosis not present

## 2019-05-12 DIAGNOSIS — E785 Hyperlipidemia, unspecified: Secondary | ICD-10-CM | POA: Diagnosis not present

## 2019-05-12 DIAGNOSIS — Z0001 Encounter for general adult medical examination with abnormal findings: Secondary | ICD-10-CM | POA: Diagnosis not present

## 2019-05-12 DIAGNOSIS — K649 Unspecified hemorrhoids: Secondary | ICD-10-CM | POA: Diagnosis not present

## 2019-06-08 DIAGNOSIS — E559 Vitamin D deficiency, unspecified: Secondary | ICD-10-CM | POA: Diagnosis not present

## 2019-06-08 DIAGNOSIS — I10 Essential (primary) hypertension: Secondary | ICD-10-CM | POA: Diagnosis not present

## 2019-06-08 DIAGNOSIS — M79671 Pain in right foot: Secondary | ICD-10-CM | POA: Diagnosis not present

## 2019-06-08 DIAGNOSIS — E1165 Type 2 diabetes mellitus with hyperglycemia: Secondary | ICD-10-CM | POA: Diagnosis not present

## 2019-07-28 DIAGNOSIS — M722 Plantar fascial fibromatosis: Secondary | ICD-10-CM | POA: Diagnosis not present

## 2019-07-28 DIAGNOSIS — I10 Essential (primary) hypertension: Secondary | ICD-10-CM | POA: Diagnosis not present

## 2019-07-28 DIAGNOSIS — E1165 Type 2 diabetes mellitus with hyperglycemia: Secondary | ICD-10-CM | POA: Diagnosis not present

## 2019-07-28 DIAGNOSIS — E559 Vitamin D deficiency, unspecified: Secondary | ICD-10-CM | POA: Diagnosis not present

## 2019-09-29 DIAGNOSIS — E1165 Type 2 diabetes mellitus with hyperglycemia: Secondary | ICD-10-CM | POA: Diagnosis not present

## 2019-09-29 DIAGNOSIS — I10 Essential (primary) hypertension: Secondary | ICD-10-CM | POA: Diagnosis not present

## 2019-09-29 DIAGNOSIS — K219 Gastro-esophageal reflux disease without esophagitis: Secondary | ICD-10-CM | POA: Diagnosis not present

## 2019-09-29 DIAGNOSIS — E559 Vitamin D deficiency, unspecified: Secondary | ICD-10-CM | POA: Diagnosis not present

## 2019-09-29 DIAGNOSIS — Z23 Encounter for immunization: Secondary | ICD-10-CM | POA: Diagnosis not present

## 2019-11-03 DIAGNOSIS — E559 Vitamin D deficiency, unspecified: Secondary | ICD-10-CM | POA: Diagnosis not present

## 2019-11-03 DIAGNOSIS — K219 Gastro-esophageal reflux disease without esophagitis: Secondary | ICD-10-CM | POA: Diagnosis not present

## 2019-11-03 DIAGNOSIS — I10 Essential (primary) hypertension: Secondary | ICD-10-CM | POA: Diagnosis not present

## 2019-11-03 DIAGNOSIS — E1165 Type 2 diabetes mellitus with hyperglycemia: Secondary | ICD-10-CM | POA: Diagnosis not present

## 2019-12-01 DIAGNOSIS — K219 Gastro-esophageal reflux disease without esophagitis: Secondary | ICD-10-CM | POA: Diagnosis not present

## 2019-12-01 DIAGNOSIS — E559 Vitamin D deficiency, unspecified: Secondary | ICD-10-CM | POA: Diagnosis not present

## 2019-12-01 DIAGNOSIS — I10 Essential (primary) hypertension: Secondary | ICD-10-CM | POA: Diagnosis not present

## 2019-12-01 DIAGNOSIS — E1165 Type 2 diabetes mellitus with hyperglycemia: Secondary | ICD-10-CM | POA: Diagnosis not present

## 2020-01-12 DIAGNOSIS — I10 Essential (primary) hypertension: Secondary | ICD-10-CM | POA: Diagnosis not present

## 2020-01-12 DIAGNOSIS — E559 Vitamin D deficiency, unspecified: Secondary | ICD-10-CM | POA: Diagnosis not present

## 2020-01-12 DIAGNOSIS — K219 Gastro-esophageal reflux disease without esophagitis: Secondary | ICD-10-CM | POA: Diagnosis not present

## 2020-01-12 DIAGNOSIS — E1165 Type 2 diabetes mellitus with hyperglycemia: Secondary | ICD-10-CM | POA: Diagnosis not present

## 2020-03-01 DIAGNOSIS — E1165 Type 2 diabetes mellitus with hyperglycemia: Secondary | ICD-10-CM | POA: Diagnosis not present

## 2020-03-01 DIAGNOSIS — R0781 Pleurodynia: Secondary | ICD-10-CM | POA: Diagnosis not present

## 2020-03-01 DIAGNOSIS — K219 Gastro-esophageal reflux disease without esophagitis: Secondary | ICD-10-CM | POA: Diagnosis not present

## 2020-03-01 DIAGNOSIS — I10 Essential (primary) hypertension: Secondary | ICD-10-CM | POA: Diagnosis not present

## 2020-03-01 DIAGNOSIS — E559 Vitamin D deficiency, unspecified: Secondary | ICD-10-CM | POA: Diagnosis not present

## 2020-04-11 DIAGNOSIS — L03012 Cellulitis of left finger: Secondary | ICD-10-CM | POA: Diagnosis not present

## 2020-04-11 DIAGNOSIS — E559 Vitamin D deficiency, unspecified: Secondary | ICD-10-CM | POA: Diagnosis not present

## 2020-04-11 DIAGNOSIS — E1165 Type 2 diabetes mellitus with hyperglycemia: Secondary | ICD-10-CM | POA: Diagnosis not present

## 2020-04-11 DIAGNOSIS — I10 Essential (primary) hypertension: Secondary | ICD-10-CM | POA: Diagnosis not present

## 2020-04-12 DIAGNOSIS — L03012 Cellulitis of left finger: Secondary | ICD-10-CM | POA: Diagnosis not present

## 2020-06-24 DIAGNOSIS — K219 Gastro-esophageal reflux disease without esophagitis: Secondary | ICD-10-CM | POA: Diagnosis not present

## 2020-06-24 DIAGNOSIS — Z0000 Encounter for general adult medical examination without abnormal findings: Secondary | ICD-10-CM | POA: Diagnosis not present

## 2020-06-24 DIAGNOSIS — E559 Vitamin D deficiency, unspecified: Secondary | ICD-10-CM | POA: Diagnosis not present

## 2020-06-24 DIAGNOSIS — E1165 Type 2 diabetes mellitus with hyperglycemia: Secondary | ICD-10-CM | POA: Diagnosis not present

## 2020-06-24 DIAGNOSIS — I10 Essential (primary) hypertension: Secondary | ICD-10-CM | POA: Diagnosis not present

## 2020-09-22 DIAGNOSIS — H5203 Hypermetropia, bilateral: Secondary | ICD-10-CM | POA: Diagnosis not present

## 2020-09-26 DIAGNOSIS — I10 Essential (primary) hypertension: Secondary | ICD-10-CM | POA: Diagnosis not present

## 2020-09-26 DIAGNOSIS — E559 Vitamin D deficiency, unspecified: Secondary | ICD-10-CM | POA: Diagnosis not present

## 2020-09-26 DIAGNOSIS — R350 Frequency of micturition: Secondary | ICD-10-CM | POA: Diagnosis not present

## 2020-09-26 DIAGNOSIS — E1165 Type 2 diabetes mellitus with hyperglycemia: Secondary | ICD-10-CM | POA: Diagnosis not present

## 2020-10-06 DIAGNOSIS — Z20822 Contact with and (suspected) exposure to covid-19: Secondary | ICD-10-CM | POA: Diagnosis not present

## 2020-10-11 DIAGNOSIS — E1165 Type 2 diabetes mellitus with hyperglycemia: Secondary | ICD-10-CM | POA: Diagnosis not present

## 2020-10-11 DIAGNOSIS — I10 Essential (primary) hypertension: Secondary | ICD-10-CM | POA: Diagnosis not present

## 2020-10-11 DIAGNOSIS — J0100 Acute maxillary sinusitis, unspecified: Secondary | ICD-10-CM | POA: Diagnosis not present

## 2020-10-11 DIAGNOSIS — R059 Cough, unspecified: Secondary | ICD-10-CM | POA: Diagnosis not present

## 2021-02-27 DIAGNOSIS — E559 Vitamin D deficiency, unspecified: Secondary | ICD-10-CM | POA: Diagnosis not present

## 2021-02-27 DIAGNOSIS — K219 Gastro-esophageal reflux disease without esophagitis: Secondary | ICD-10-CM | POA: Diagnosis not present

## 2021-02-27 DIAGNOSIS — I10 Essential (primary) hypertension: Secondary | ICD-10-CM | POA: Diagnosis not present

## 2021-02-27 DIAGNOSIS — E1165 Type 2 diabetes mellitus with hyperglycemia: Secondary | ICD-10-CM | POA: Diagnosis not present

## 2021-05-03 DIAGNOSIS — E1165 Type 2 diabetes mellitus with hyperglycemia: Secondary | ICD-10-CM | POA: Diagnosis not present

## 2021-05-03 DIAGNOSIS — E559 Vitamin D deficiency, unspecified: Secondary | ICD-10-CM | POA: Diagnosis not present

## 2021-05-03 DIAGNOSIS — I10 Essential (primary) hypertension: Secondary | ICD-10-CM | POA: Diagnosis not present

## 2021-05-03 DIAGNOSIS — K219 Gastro-esophageal reflux disease without esophagitis: Secondary | ICD-10-CM | POA: Diagnosis not present

## 2021-07-23 DIAGNOSIS — R519 Headache, unspecified: Secondary | ICD-10-CM | POA: Diagnosis not present

## 2021-07-23 DIAGNOSIS — R509 Fever, unspecified: Secondary | ICD-10-CM | POA: Diagnosis not present

## 2021-08-08 DIAGNOSIS — I10 Essential (primary) hypertension: Secondary | ICD-10-CM | POA: Diagnosis not present

## 2021-08-08 DIAGNOSIS — E559 Vitamin D deficiency, unspecified: Secondary | ICD-10-CM | POA: Diagnosis not present

## 2021-08-08 DIAGNOSIS — Z0000 Encounter for general adult medical examination without abnormal findings: Secondary | ICD-10-CM | POA: Diagnosis not present

## 2021-08-08 DIAGNOSIS — E1165 Type 2 diabetes mellitus with hyperglycemia: Secondary | ICD-10-CM | POA: Diagnosis not present

## 2021-08-08 DIAGNOSIS — K219 Gastro-esophageal reflux disease without esophagitis: Secondary | ICD-10-CM | POA: Diagnosis not present

## 2021-08-08 DIAGNOSIS — E785 Hyperlipidemia, unspecified: Secondary | ICD-10-CM | POA: Diagnosis not present

## 2021-11-08 DIAGNOSIS — I10 Essential (primary) hypertension: Secondary | ICD-10-CM | POA: Diagnosis not present

## 2021-11-08 DIAGNOSIS — R300 Dysuria: Secondary | ICD-10-CM | POA: Diagnosis not present

## 2021-11-08 DIAGNOSIS — E559 Vitamin D deficiency, unspecified: Secondary | ICD-10-CM | POA: Diagnosis not present

## 2021-11-08 DIAGNOSIS — E1165 Type 2 diabetes mellitus with hyperglycemia: Secondary | ICD-10-CM | POA: Diagnosis not present

## 2021-11-28 DIAGNOSIS — R3589 Other polyuria: Secondary | ICD-10-CM | POA: Diagnosis not present

## 2021-11-28 DIAGNOSIS — N3281 Overactive bladder: Secondary | ICD-10-CM | POA: Diagnosis not present

## 2021-11-28 DIAGNOSIS — R339 Retention of urine, unspecified: Secondary | ICD-10-CM | POA: Diagnosis not present

## 2021-12-11 DIAGNOSIS — N3010 Interstitial cystitis (chronic) without hematuria: Secondary | ICD-10-CM | POA: Diagnosis not present

## 2021-12-28 DIAGNOSIS — N3010 Interstitial cystitis (chronic) without hematuria: Secondary | ICD-10-CM | POA: Diagnosis not present

## 2022-01-04 DIAGNOSIS — N3010 Interstitial cystitis (chronic) without hematuria: Secondary | ICD-10-CM | POA: Diagnosis not present

## 2022-01-11 DIAGNOSIS — N3010 Interstitial cystitis (chronic) without hematuria: Secondary | ICD-10-CM | POA: Diagnosis not present

## 2022-01-18 DIAGNOSIS — N3010 Interstitial cystitis (chronic) without hematuria: Secondary | ICD-10-CM | POA: Diagnosis not present

## 2022-02-08 DIAGNOSIS — N3010 Interstitial cystitis (chronic) without hematuria: Secondary | ICD-10-CM | POA: Diagnosis not present

## 2022-02-08 DIAGNOSIS — R339 Retention of urine, unspecified: Secondary | ICD-10-CM | POA: Diagnosis not present

## 2022-02-15 DIAGNOSIS — E1165 Type 2 diabetes mellitus with hyperglycemia: Secondary | ICD-10-CM | POA: Diagnosis not present

## 2022-02-15 DIAGNOSIS — I10 Essential (primary) hypertension: Secondary | ICD-10-CM | POA: Diagnosis not present

## 2022-02-15 DIAGNOSIS — E559 Vitamin D deficiency, unspecified: Secondary | ICD-10-CM | POA: Diagnosis not present

## 2022-02-15 DIAGNOSIS — R1012 Left upper quadrant pain: Secondary | ICD-10-CM | POA: Diagnosis not present

## 2022-02-16 ENCOUNTER — Other Ambulatory Visit: Payer: Self-pay | Admitting: Physician Assistant

## 2022-02-16 DIAGNOSIS — R109 Unspecified abdominal pain: Secondary | ICD-10-CM

## 2022-03-02 ENCOUNTER — Ambulatory Visit
Admission: RE | Admit: 2022-03-02 | Discharge: 2022-03-02 | Disposition: A | Discharge status: Home / Self Care | Payer: BC Managed Care – PPO | Source: Ambulatory Visit | Attending: Physician Assistant | Admitting: Physician Assistant

## 2022-03-02 DIAGNOSIS — R109 Unspecified abdominal pain: Secondary | ICD-10-CM

## 2022-03-07 DIAGNOSIS — K760 Fatty (change of) liver, not elsewhere classified: Secondary | ICD-10-CM | POA: Diagnosis not present

## 2022-03-07 DIAGNOSIS — I10 Essential (primary) hypertension: Secondary | ICD-10-CM | POA: Diagnosis not present

## 2022-03-07 DIAGNOSIS — E1165 Type 2 diabetes mellitus with hyperglycemia: Secondary | ICD-10-CM | POA: Diagnosis not present

## 2022-03-07 DIAGNOSIS — E559 Vitamin D deficiency, unspecified: Secondary | ICD-10-CM | POA: Diagnosis not present

## 2022-03-07 DIAGNOSIS — E785 Hyperlipidemia, unspecified: Secondary | ICD-10-CM | POA: Diagnosis not present

## 2022-06-06 DIAGNOSIS — E559 Vitamin D deficiency, unspecified: Secondary | ICD-10-CM | POA: Diagnosis not present

## 2022-06-06 DIAGNOSIS — I10 Essential (primary) hypertension: Secondary | ICD-10-CM | POA: Diagnosis not present

## 2022-06-06 DIAGNOSIS — E1165 Type 2 diabetes mellitus with hyperglycemia: Secondary | ICD-10-CM | POA: Diagnosis not present

## 2022-06-06 DIAGNOSIS — E785 Hyperlipidemia, unspecified: Secondary | ICD-10-CM | POA: Diagnosis not present

## 2022-08-16 DIAGNOSIS — E1165 Type 2 diabetes mellitus with hyperglycemia: Secondary | ICD-10-CM | POA: Diagnosis not present

## 2022-08-16 DIAGNOSIS — E559 Vitamin D deficiency, unspecified: Secondary | ICD-10-CM | POA: Diagnosis not present

## 2022-08-16 DIAGNOSIS — I10 Essential (primary) hypertension: Secondary | ICD-10-CM | POA: Diagnosis not present

## 2022-08-16 DIAGNOSIS — E785 Hyperlipidemia, unspecified: Secondary | ICD-10-CM | POA: Diagnosis not present

## 2022-08-16 DIAGNOSIS — Z0000 Encounter for general adult medical examination without abnormal findings: Secondary | ICD-10-CM | POA: Diagnosis not present

## 2022-08-16 DIAGNOSIS — K760 Fatty (change of) liver, not elsewhere classified: Secondary | ICD-10-CM | POA: Diagnosis not present

## 2022-09-10 DIAGNOSIS — Z1231 Encounter for screening mammogram for malignant neoplasm of breast: Secondary | ICD-10-CM | POA: Diagnosis not present

## 2022-10-12 DIAGNOSIS — H1032 Unspecified acute conjunctivitis, left eye: Secondary | ICD-10-CM | POA: Diagnosis not present

## 2022-10-12 DIAGNOSIS — H01005 Unspecified blepharitis left lower eyelid: Secondary | ICD-10-CM | POA: Diagnosis not present

## 2022-10-23 IMAGING — US US ABDOMEN COMPLETE
1 series · 14 of 25 positions shown · non-contrast
Comparison: CT June 09, 2015

CLINICAL DATA: One year of epigastric, left flank and left upper
quadrant pain.

EXAM:
ABDOMEN ULTRASOUND COMPLETE

[Series 1: us abdomen complete · 0.20mm/px · 14 of 59 slices shown]
[im 1/59]
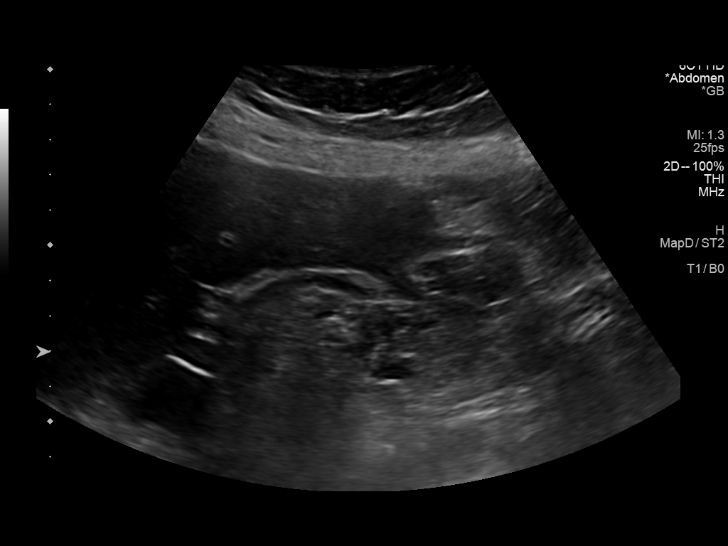
[im 5/59]
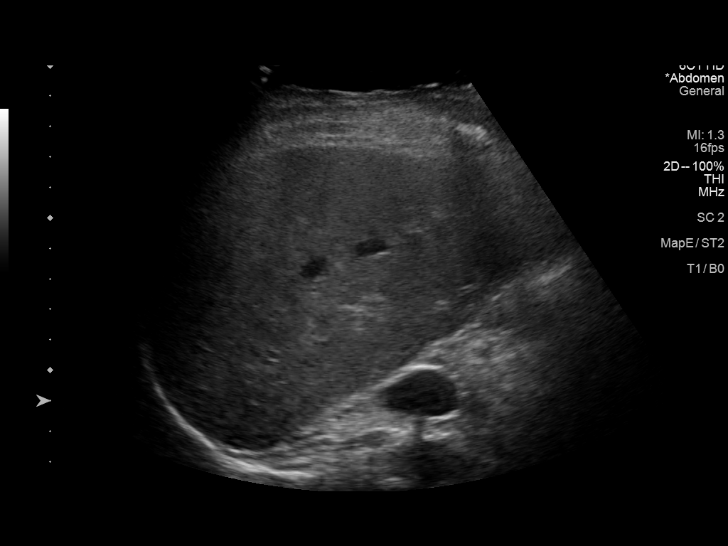
[im 10/59]
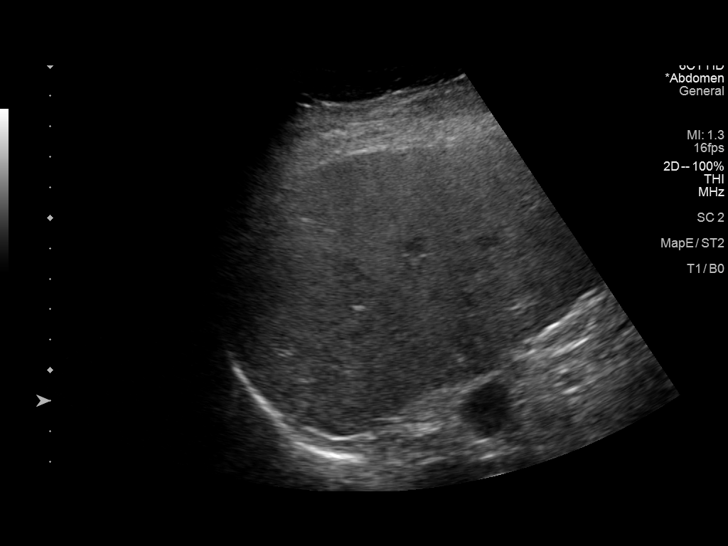
[im 15/59]
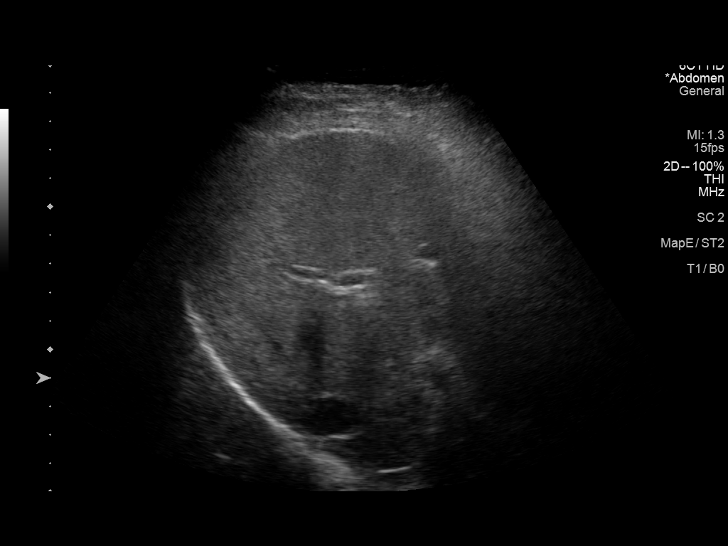
[im 20/59]
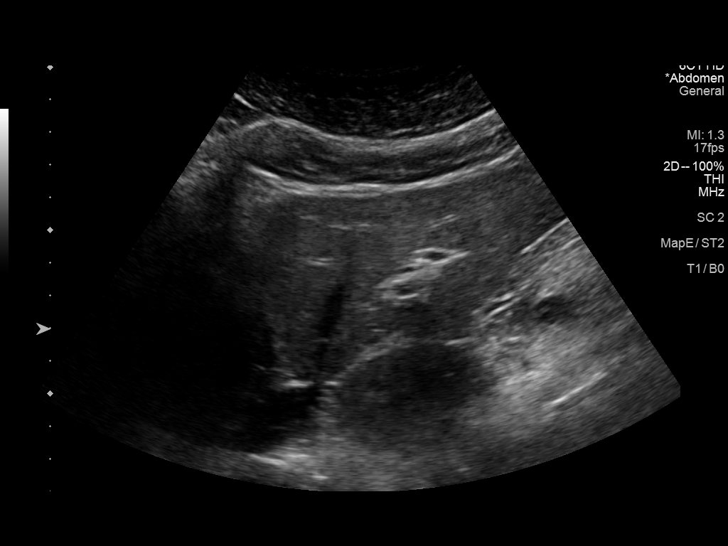
[im 22/59]
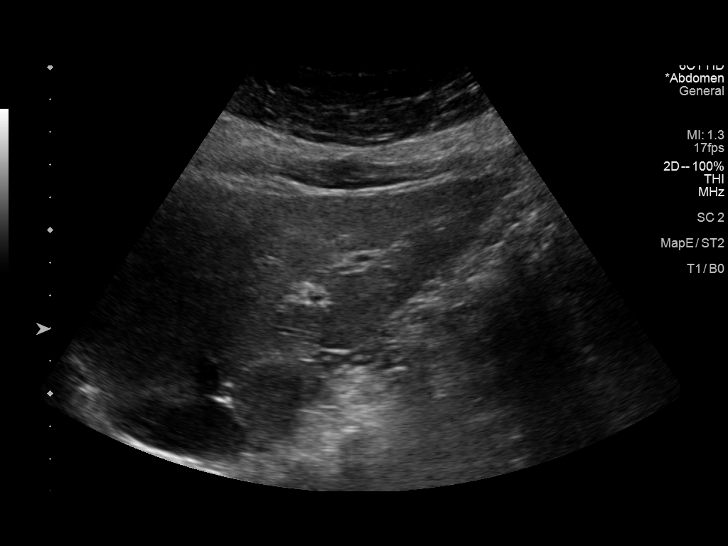
[im 27/59]
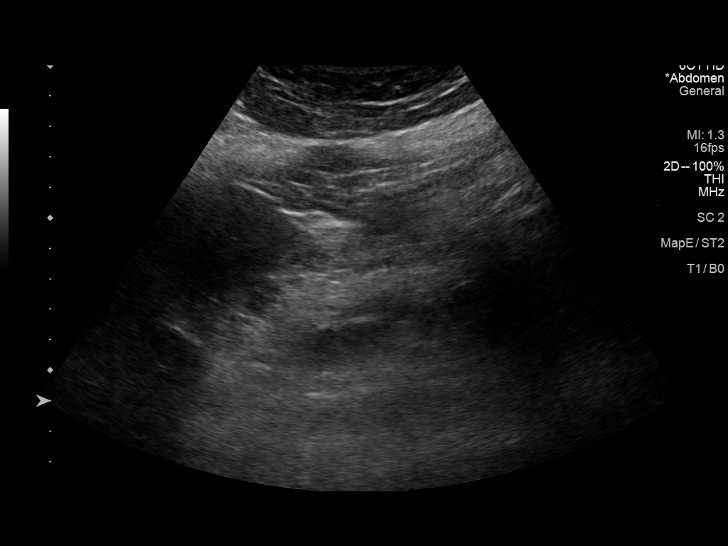
[im 32/59]
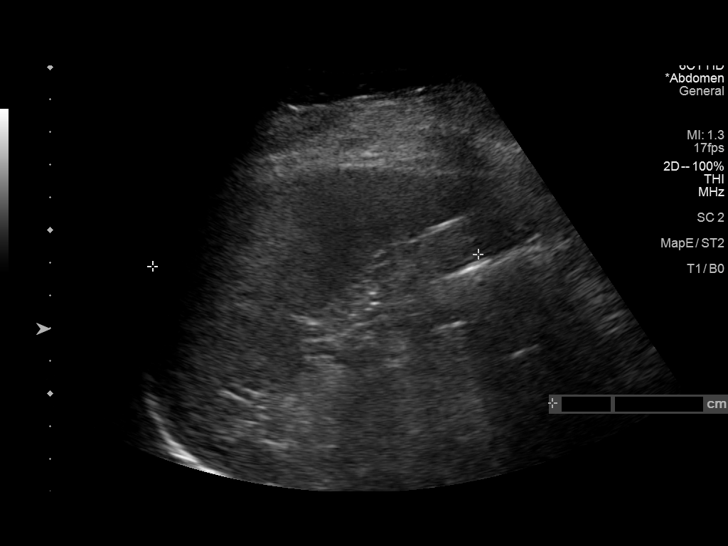
[im 37/59]
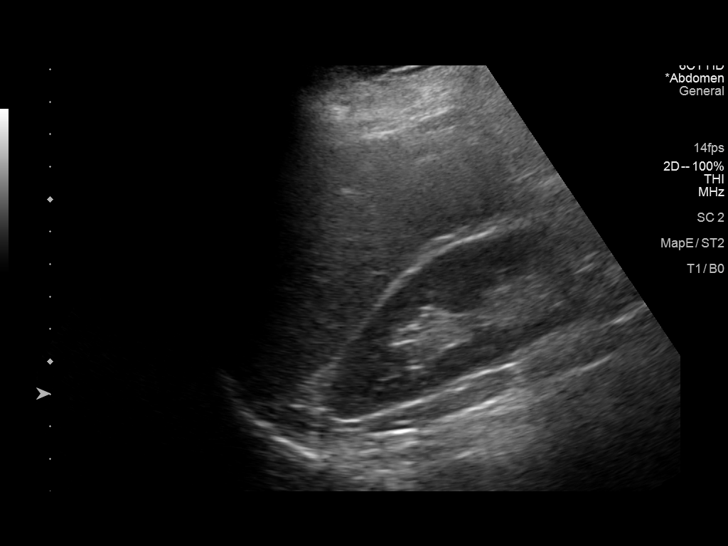
[im 39/59]
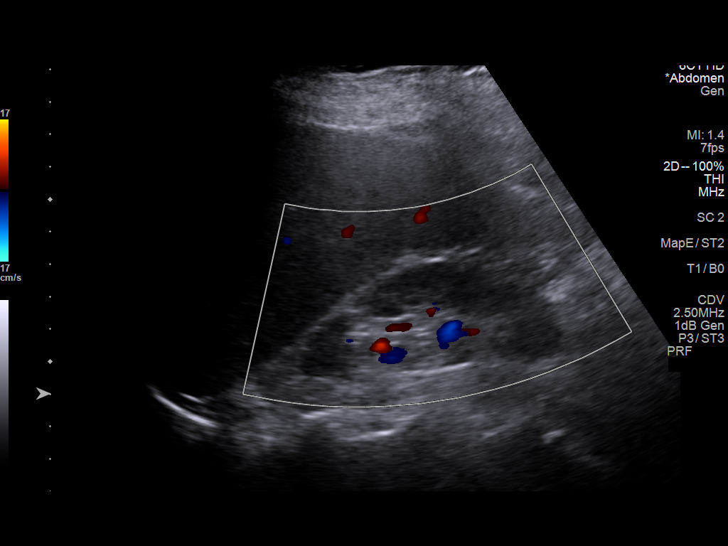
[im 44/59]
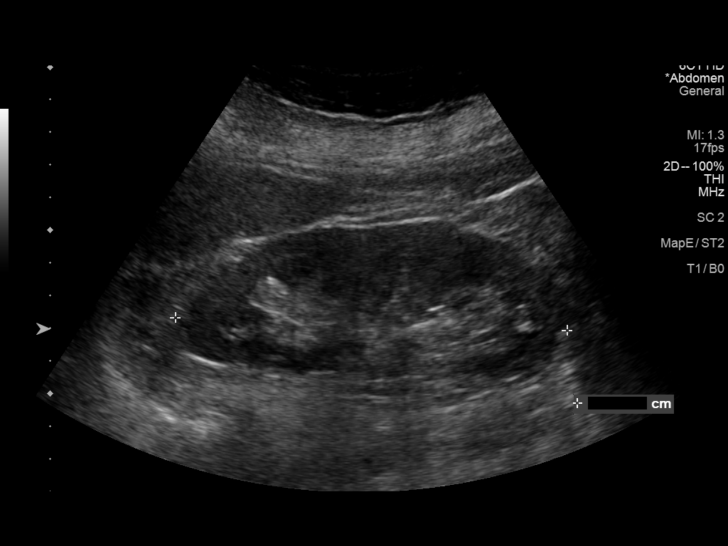
[im 49/59]
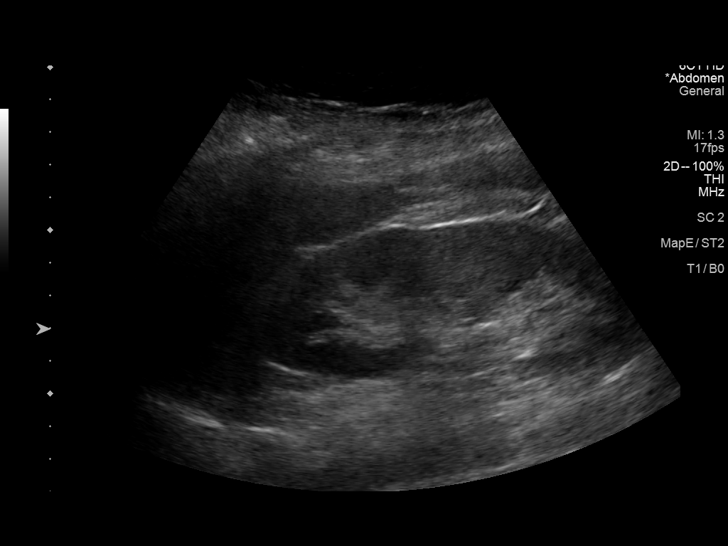
[im 54/59]
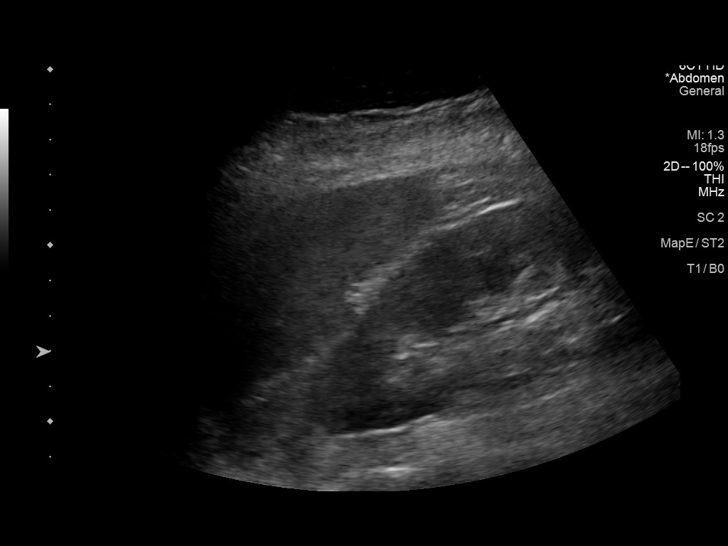
[im 59/59]
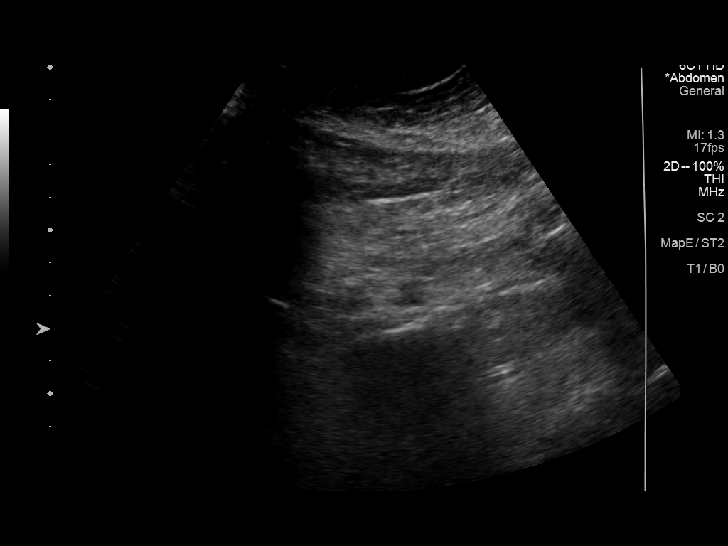

[14 of 25 positions shown; findings below may reference images not displayed]

FINDINGS: Gallbladder: Surgically absent

Common bile duct: Diameter: 3 mm

Liver: No focal lesion identified. Increased parenchymal
echogenicity with coarsened hepatic echotexture. No discrete contour
nodularity. Portal vein is patent on color Doppler imaging with
normal direction of blood flow towards the liver.

IVC: No abnormality visualized.

Pancreas: Visualized portion unremarkable.

Spleen: Size and appearance within normal limits.

Right Kidney: Length: 12 cm. Echogenicity within normal limits. No
mass or hydronephrosis visualized.

Left Kidney: Length: 12 cm. Echogenicity within normal limits. No
mass or hydronephrosis visualized.

Abdominal aorta: No aneurysm visualized.

Other findings: None.
IMPRESSION: 1. The liver shows diffusely coarsened echotexture with decreased
through-transmission and increased echogenicity. Nonspecific imaging
findings but most commonly reflecting hepatic steatosis or chronic
hepatocellular disease. There are no obvious focal liver lesions.
2. Prior cholecystectomy.

## 2022-11-01 DIAGNOSIS — E785 Hyperlipidemia, unspecified: Secondary | ICD-10-CM | POA: Diagnosis not present

## 2022-11-01 DIAGNOSIS — E1165 Type 2 diabetes mellitus with hyperglycemia: Secondary | ICD-10-CM | POA: Diagnosis not present

## 2022-11-01 DIAGNOSIS — E559 Vitamin D deficiency, unspecified: Secondary | ICD-10-CM | POA: Diagnosis not present

## 2022-11-01 DIAGNOSIS — I10 Essential (primary) hypertension: Secondary | ICD-10-CM | POA: Diagnosis not present

## 2022-12-25 DIAGNOSIS — H524 Presbyopia: Secondary | ICD-10-CM | POA: Diagnosis not present

## 2022-12-25 DIAGNOSIS — H5203 Hypermetropia, bilateral: Secondary | ICD-10-CM | POA: Diagnosis not present

## 2022-12-25 DIAGNOSIS — H52223 Regular astigmatism, bilateral: Secondary | ICD-10-CM | POA: Diagnosis not present

## 2023-01-31 DIAGNOSIS — E785 Hyperlipidemia, unspecified: Secondary | ICD-10-CM | POA: Diagnosis not present

## 2023-01-31 DIAGNOSIS — E559 Vitamin D deficiency, unspecified: Secondary | ICD-10-CM | POA: Diagnosis not present

## 2023-01-31 DIAGNOSIS — I10 Essential (primary) hypertension: Secondary | ICD-10-CM | POA: Diagnosis not present

## 2023-01-31 DIAGNOSIS — E1165 Type 2 diabetes mellitus with hyperglycemia: Secondary | ICD-10-CM | POA: Diagnosis not present

## 2023-01-31 DIAGNOSIS — K219 Gastro-esophageal reflux disease without esophagitis: Secondary | ICD-10-CM | POA: Diagnosis not present

## 2023-06-24 ENCOUNTER — Encounter (HOSPITAL_BASED_OUTPATIENT_CLINIC_OR_DEPARTMENT_OTHER): Payer: Self-pay | Admitting: Urology

## 2023-06-24 ENCOUNTER — Other Ambulatory Visit: Payer: Self-pay

## 2023-06-24 ENCOUNTER — Inpatient Hospital Stay (HOSPITAL_BASED_OUTPATIENT_CLINIC_OR_DEPARTMENT_OTHER)
Admission: EM | Admit: 2023-06-24 | Discharge: 2023-06-26 | DRG: 389 | Disposition: A | Discharge status: Home / Self Care | Payer: BLUE CROSS/BLUE SHIELD | Attending: Family Medicine | Admitting: Family Medicine

## 2023-06-24 ENCOUNTER — Emergency Department (HOSPITAL_BASED_OUTPATIENT_CLINIC_OR_DEPARTMENT_OTHER): Payer: BLUE CROSS/BLUE SHIELD

## 2023-06-24 DIAGNOSIS — K5651 Intestinal adhesions [bands], with partial obstruction: Principal | ICD-10-CM | POA: Diagnosis present

## 2023-06-24 DIAGNOSIS — Z7985 Long-term (current) use of injectable non-insulin antidiabetic drugs: Secondary | ICD-10-CM

## 2023-06-24 DIAGNOSIS — I10 Essential (primary) hypertension: Secondary | ICD-10-CM | POA: Diagnosis present

## 2023-06-24 DIAGNOSIS — E119 Type 2 diabetes mellitus without complications: Secondary | ICD-10-CM | POA: Diagnosis present

## 2023-06-24 DIAGNOSIS — M1990 Unspecified osteoarthritis, unspecified site: Secondary | ICD-10-CM | POA: Diagnosis present

## 2023-06-24 DIAGNOSIS — D72829 Elevated white blood cell count, unspecified: Secondary | ICD-10-CM | POA: Diagnosis present

## 2023-06-24 DIAGNOSIS — Z7984 Long term (current) use of oral hypoglycemic drugs: Secondary | ICD-10-CM

## 2023-06-24 DIAGNOSIS — Z79899 Other long term (current) drug therapy: Secondary | ICD-10-CM

## 2023-06-24 DIAGNOSIS — Z90710 Acquired absence of both cervix and uterus: Secondary | ICD-10-CM

## 2023-06-24 DIAGNOSIS — K56609 Unspecified intestinal obstruction, unspecified as to partial versus complete obstruction: Secondary | ICD-10-CM

## 2023-06-24 DIAGNOSIS — Z888 Allergy status to other drugs, medicaments and biological substances status: Secondary | ICD-10-CM

## 2023-06-24 DIAGNOSIS — K5090 Crohn's disease, unspecified, without complications: Secondary | ICD-10-CM | POA: Diagnosis present

## 2023-06-24 DIAGNOSIS — Z603 Acculturation difficulty: Secondary | ICD-10-CM | POA: Diagnosis present

## 2023-06-24 DIAGNOSIS — K56600 Partial intestinal obstruction, unspecified as to cause: Secondary | ICD-10-CM

## 2023-06-24 DIAGNOSIS — R1031 Right lower quadrant pain: Secondary | ICD-10-CM | POA: Diagnosis not present

## 2023-06-24 DIAGNOSIS — Z9049 Acquired absence of other specified parts of digestive tract: Secondary | ICD-10-CM

## 2023-06-24 LAB — URINALYSIS, ROUTINE W REFLEX MICROSCOPIC
Bilirubin Urine: NEGATIVE
Glucose, UA: NEGATIVE mg/dL
Hgb urine dipstick: NEGATIVE
Ketones, ur: NEGATIVE mg/dL
Leukocytes,Ua: NEGATIVE
Nitrite: NEGATIVE
Protein, ur: NEGATIVE mg/dL
Specific Gravity, Urine: 1.02 (ref 1.005–1.030)
pH: 8 (ref 5.0–8.0)

## 2023-06-24 LAB — BASIC METABOLIC PANEL
Anion gap: 9 (ref 5–15)
BUN: 7 mg/dL (ref 6–20)
CO2: 27 mmol/L (ref 22–32)
Calcium: 9 mg/dL (ref 8.9–10.3)
Chloride: 101 mmol/L (ref 98–111)
Creatinine, Ser: 0.75 mg/dL (ref 0.44–1.00)
GFR, Estimated: >60 mL/min (ref >60)
Glucose, Bld: 152 mg/dL — ABNORMAL HIGH (ref 70–99)
Potassium: 4.5 mmol/L (ref 3.5–5.1)
Sodium: 137 mmol/L (ref 135–145)

## 2023-06-24 LAB — CBC
HCT: 35.9 % — ABNORMAL LOW (ref 36.0–46.0)
Hemoglobin: 11.6 g/dL — ABNORMAL LOW (ref 12.0–15.0)
MCH: 26.7 pg (ref 26.0–34.0)
MCHC: 32.3 g/dL (ref 30.0–36.0)
MCV: 82.5 fL (ref 80.0–100.0)
Platelets: 377 10*3/uL (ref 150–400)
RBC: 4.35 MIL/uL (ref 3.87–5.11)
RDW: 14.6 % (ref 11.5–15.5)
WBC: 10.7 10*3/uL — ABNORMAL HIGH (ref 4.0–10.5)
nRBC: 0 % (ref 0.0–0.2)

## 2023-06-24 MED ORDER — MORPHINE SULFATE (PF) 4 MG/ML IV SOLN
4.0000 mg | Freq: Once | INTRAVENOUS | Status: AC
  Administered 2023-06-24: 4 mg via INTRAVENOUS
  Filled 2023-06-24: qty 1

## 2023-06-24 MED ORDER — SODIUM CHLORIDE 0.9 % IV BOLUS
1000.0000 mL | Freq: Once | INTRAVENOUS | Status: AC
  Administered 2023-06-24: 1000 mL via INTRAVENOUS

## 2023-06-24 NOTE — ED Notes (Signed)
Attempted to call report, stated room has changed to 4W

## 2023-06-24 NOTE — ED Notes (Signed)
[facility] called for transport at 11:35p

## 2023-06-24 NOTE — Progress Notes (Signed)
Plan of Care Note for accepted transfer   Patient: Carrie Larson MRN: [id number]   DOA: 06/24/2023  Facility requesting transfer: [facility]   Requesting Provider: Dr. Particia Nearing   Reason for transfer: Partial SBO   Facility course: 62 yr old lady with hx of T2DM and SBO p/w right flank pain and has CT concerning for partial small bowel obstruction. CT findings also raise question of possible enteroenteric fistula.   She was given IVF and analgesia. She is not vomiting and surgery (Dr. Magnus Ivan) did not think she need NGT now but they will consult on her in the am.   Plan of care: The patient is accepted for admission to Med-surg  unit, at [facility].   Author: Briscoe Deutscher, MD 06/24/2023  Check [email or web address] for on-call coverage.  Nursing staff, Please call [facility] Admits & Consults System-Wide number on Amion as soon as patient's arrival, so appropriate admitting provider can evaluate the pt.

## 2023-06-24 NOTE — ED Triage Notes (Signed)
Right sided flank pain that started 2 weeks ago  Burning with urination, hematuria noted  Fever at home

## 2023-06-24 NOTE — ED Provider Notes (Signed)
[place] [facility] Provider Note   CSN: [id number] Arrival date & time: 06/24/23  1946     History  Chief Complaint  Patient presents with   Flank Pain    Carrie Larson is a 62 y.o. female. With a history of previous bowel obstructions, numerous abdominal surgeries, type 2 diabetes who presents to the ED for evaluation of right sided abdominal pain. She has had pain for the past 2 weeks which has gotten progressively worse. She localizes this pain to the right mid and lower abdomen with radiation to the flank. It is constant. Currently rates the pain at a 10 out of 10. reports some nausea but no vomiting. She has been having small and hard bowel movements. No diarrhea. No fevers but does endorse chills. She reports burning with urination as well. She states her symptoms feel similar to the last 2 bowel obstructions she has had.   History is provided by the patient and husband with the use of a spanish interpreter.   Flank Pain Associated symptoms include abdominal pain.       Home Medications Prior to Admission medications   Medication Sig Start Date End Date Taking? Authorizing Provider  docusate sodium (COLACE) 100 MG capsule Take 1 capsule (100 mg total) by mouth 2 (two) times daily. 03/27/15   Richarda Overlie, MD  fluticasone (FLONASE) 50 MCG/ACT nasal spray Place 2 sprays into both nostrils daily as needed for allergies or rhinitis.    [provider]  lisinopril (PRINIVIL,ZESTRIL) 20 MG tablet Take 0.5 tablets (10 mg total) by mouth daily. 03/11/15   Albertine Grates, MD  metFORMIN (GLUCOPHAGE) 500 MG tablet Take 500 mg by mouth 2 (two) times daily with a meal.    [provider]  pantoprazole (PROTONIX) 40 MG tablet Take 1 tablet by mouth daily. 03/15/15   [provider]      Allergies    Reglan [metoclopramide] and Prednisone    Review of Systems   Review of Systems  Gastrointestinal:  Positive for abdominal pain  and nausea.  All other systems reviewed and are negative.   Physical Exam Updated Vital Signs BP 127/64   Pulse 67   Temp 97.8 F (36.6 C)   Resp 17   Ht 5\' 7"  (1.702 m)   Wt 86.2 kg   SpO2 98%   BMI 29.76 kg/m  Physical Exam Vitals and nursing note reviewed.  Constitutional:      General: She is not in acute distress.    Appearance: She is well-developed. She is not ill-appearing, toxic-appearing or diaphoretic.  HENT:     Head: Normocephalic and atraumatic.  Eyes:     Conjunctiva/sclera: Conjunctivae normal.  Cardiovascular:     Rate and Rhythm: Normal rate and regular rhythm.     Heart sounds: No murmur heard. Pulmonary:     Effort: Pulmonary effort is normal. No respiratory distress.     Breath sounds: Normal breath sounds. No wheezing, rhonchi or rales.  Abdominal:     Palpations: Abdomen is soft.     Tenderness: There is abdominal tenderness (right abdomen). There is no guarding.     Comments: Large mid abdomen surgical scar. Normoactive bowel sounds  Musculoskeletal:        General: No swelling.     Cervical back: Neck supple.  Skin:    General: Skin is warm and dry.     Capillary Refill: Capillary refill takes less than 2 seconds.  Neurological:  General: No focal deficit present.     Mental Status: She is alert and oriented to person, place, and time.  Psychiatric:        Mood and Affect: Mood normal.        Behavior: Behavior normal.     ED Results / Procedures / Treatments   Labs (all labs ordered are listed, but only abnormal results are displayed) Labs Reviewed  CBC - Abnormal; Notable for the following components:      Result Value   WBC 10.7 (*)    Hemoglobin 11.6 (*)    HCT 35.9 (*)    All other components within normal limits  BASIC METABOLIC PANEL - Abnormal; Notable for the following components:   Glucose, Bld 152 (*)    All other components within normal limits  URINALYSIS, ROUTINE W REFLEX MICROSCOPIC     EKG None  Radiology CT Renal Stone Study  Result Date: 06/24/2023 CLINICAL DATA:  Right abdominal/flank pain, dysuria, hematuria EXAM: CT ABDOMEN AND PELVIS WITHOUT CONTRAST TECHNIQUE: Multidetector CT imaging of the abdomen and pelvis was performed following the standard protocol without IV contrast. RADIATION DOSE REDUCTION: This exam was performed according to the departmental dose-optimization program which includes automated exposure control, adjustment of the mA and/or kV according to patient size and/or use of iterative reconstruction technique. COMPARISON:  None Available. FINDINGS: Lower chest: Lung bases are clear. Hepatobiliary: Unenhanced liver is unremarkable. Status post cholecystectomy. No intrahepatic or extrahepatic duct dilatation. Pancreas: Within normal limits. Spleen: Within normal limits. Adrenals/Urinary Tract: Adrenal glands are within normal limits. Kidneys are within normal limits. No renal, ureteral, or bladder calculi. No hydronephrosis. Bladder is underdistended but unremarkable. Stomach/Bowel: Stomach is within normal limits. Prior small bowel resection with suture line in the right mid/lower abdomen (series 2/image 58). Associated bowel wall thickening with pericolonic inflammation adjacent to the suture line (series 2/image 63). Suspected tethering of adjacent loops of terminal ileum (coronal image 60). Proximal to the suture, there is small bowel dilatation with small bowel stasis (series 2/image 70). Overall, this appearance favors partial small bowel obstruction with transition/narrowing adjacent to the suture line, likely secondary to adhesions. Given adjacent inflammatory changes, enteroenteric fistula is not excluded. Normal appendix (series 2/image 60). No colonic wall thickening or inflammatory changes. Vascular/Lymphatic: No evidence of abdominal aortic aneurysm. No suspicious abdominopelvic lymphadenopathy. Reproductive: Status post hysterectomy. Bilateral ovaries  are within normal limits. Other: No abdominopelvic ascites. Postsurgical changes related to prior ventral hernia mesh repair. Musculoskeletal: Visualized osseous structures are within normal limits. IMPRESSION: Suspected partial small-bowel obstruction, favored to be secondary to adhesions near the small bowel anastomosis in the right mid/lower abdomen. Secondary wall thickening with inflammatory changes. Given this, an enteroenteric fistula is not excluded. Surgical consultation is suggested. Electronically Signed   By: Charline Bills M.D.   On: 06/24/2023 21:06    Procedures Procedures    Medications Ordered in ED Medications  sodium chloride 0.9 % bolus 1,000 mL (1,000 mLs Intravenous New Bag/Given 06/24/23 2257)  morphine (PF) 4 MG/ML injection 4 mg (4 mg Intravenous Given 06/24/23 2257)    ED Course/ Medical Decision Making/ A&P Clinical Course as of 06/24/23 2342  Mon Jun 24, 2023  2227 Spoke with general surgery Dr. Magnus Ivan who recommends admission to hospitalist service, will consult tomorrow [AS]  2341 Patient reports significant improvement in her pain. [AS]    Clinical Course User Index [AS] Michelle Piper, PA-C  Medical Decision Making Amount and/or Complexity of Data Reviewed Labs: ordered. Radiology: ordered.  Risk Prescription drug management.  This patient presents to the ED for concern of right sided abdominal pain, this involves an extensive number of treatment options, and is a complaint that carries with it a high risk of complications and morbidity.  The differential diagnosis for generalized abdominal pain includes, but is not limited to AAA, gastroenteritis, appendicitis, Bowel obstruction, Bowel perforation. Gastroparesis, DKA, Hernia, Inflammatory bowel disease, mesenteric ischemia, pancreatitis, peritonitis SBP, volvulus.   Co morbidities that complicate the patient evaluation   previous bowel obstructions, numerous  abdominal surgeries, type 2 diabetes  My initial workup includes labs, imaging, symptom control  Additional history obtained from: Nursing notes from this visit. Family husband provides a portion of the history  I ordered, reviewed and interpreted labs which include: BMP, CBC, urinalysis. Leukocytosis of 10.7, anemia with a hemoglobin of 11.6,   I ordered imaging studies including  CT stone study I independently visualized and interpreted imaging which showed Suspected partial small-bowel obstruction, favored to be secondary to adhesions near the small bowel anastomosis in the right mid/lower abdomen. Secondary wall thickening with inflammatory changes. Given this, an enteroenteric fistula is not excluded I agree with the radiologist interpretation  Consultations Obtained:  I requested consultation with the general surgeon Dr. Magnus Ivan,  and discussed lab and imaging findings as well as pertinent plan - they recommend: admission to hospitalist service, will consult tomorrow.   Hospitalist Dr. Antionette Char to accept admission.   Afebrile, hemodynamically stable. 62 year old female presenting to the ED for evaluation of right sided abdominal pain for the past 2 weeks which has progressively gotten worse. She says her symptoms feel similar to previous small bowel obstructions. She has mild nausea without vomiting. She is having small bowel movements and passing gas. She has moderate right sided abdominal pain. Scan concerning for SBO with possible enteroenteric fistula. Surgery recommends admission to hospitalist with formal consult tomorrow. Hospitalist will admit. Patient is in agreement with this plan. Stable at the time of admission.  Patient's case discussed with Dr. Particia Nearing who agrees with plan to admit.   Note: Portions of this report may have been transcribed using voice recognition software. Every effort was made to ensure accuracy; however, inadvertent computerized transcription errors may  still be present.        Final Clinical Impression(s) / ED Diagnoses Final diagnoses:  Partial small bowel obstruction [facility])    Rx / DC Orders ED Discharge Orders     None         Mora Bellman 06/24/23 2342    Jacalyn Lefevre, MD 06/25/23 0000

## 2023-06-25 ENCOUNTER — Inpatient Hospital Stay (HOSPITAL_COMMUNITY): Payer: BLUE CROSS/BLUE SHIELD

## 2023-06-25 DIAGNOSIS — M1990 Unspecified osteoarthritis, unspecified site: Secondary | ICD-10-CM | POA: Diagnosis present

## 2023-06-25 DIAGNOSIS — D72829 Elevated white blood cell count, unspecified: Secondary | ICD-10-CM | POA: Diagnosis present

## 2023-06-25 DIAGNOSIS — Z90710 Acquired absence of both cervix and uterus: Secondary | ICD-10-CM | POA: Diagnosis not present

## 2023-06-25 DIAGNOSIS — Z79899 Other long term (current) drug therapy: Secondary | ICD-10-CM | POA: Diagnosis not present

## 2023-06-25 DIAGNOSIS — Z888 Allergy status to other drugs, medicaments and biological substances status: Secondary | ICD-10-CM | POA: Diagnosis not present

## 2023-06-25 DIAGNOSIS — Z603 Acculturation difficulty: Secondary | ICD-10-CM | POA: Diagnosis present

## 2023-06-25 DIAGNOSIS — K5090 Crohn's disease, unspecified, without complications: Secondary | ICD-10-CM | POA: Diagnosis present

## 2023-06-25 DIAGNOSIS — R1031 Right lower quadrant pain: Secondary | ICD-10-CM | POA: Diagnosis present

## 2023-06-25 DIAGNOSIS — K56600 Partial intestinal obstruction, unspecified as to cause: Secondary | ICD-10-CM | POA: Diagnosis not present

## 2023-06-25 DIAGNOSIS — I10 Essential (primary) hypertension: Secondary | ICD-10-CM | POA: Diagnosis present

## 2023-06-25 DIAGNOSIS — K5651 Intestinal adhesions [bands], with partial obstruction: Secondary | ICD-10-CM | POA: Diagnosis present

## 2023-06-25 DIAGNOSIS — Z9049 Acquired absence of other specified parts of digestive tract: Secondary | ICD-10-CM | POA: Diagnosis not present

## 2023-06-25 DIAGNOSIS — Z7985 Long-term (current) use of injectable non-insulin antidiabetic drugs: Secondary | ICD-10-CM | POA: Diagnosis not present

## 2023-06-25 DIAGNOSIS — Z7984 Long term (current) use of oral hypoglycemic drugs: Secondary | ICD-10-CM | POA: Diagnosis not present

## 2023-06-25 DIAGNOSIS — E119 Type 2 diabetes mellitus without complications: Secondary | ICD-10-CM | POA: Diagnosis present

## 2023-06-25 LAB — CBC WITH DIFFERENTIAL/PLATELET
Abs Immature Granulocytes: 0.04 10*3/uL (ref 0.00–0.07)
Basophils Absolute: 0 10*3/uL (ref 0.0–0.1)
Basophils Relative: 0 %
Eosinophils Absolute: 0.1 10*3/uL (ref 0.0–0.5)
Eosinophils Relative: 1 %
HCT: 35.5 % — ABNORMAL LOW (ref 36.0–46.0)
Hemoglobin: 10.8 g/dL — ABNORMAL LOW (ref 12.0–15.0)
Immature Granulocytes: 0 %
Lymphocytes Relative: 10 %
Lymphs Abs: 1.2 10*3/uL (ref 0.7–4.0)
MCH: 25.8 pg — ABNORMAL LOW (ref 26.0–34.0)
MCHC: 30.4 g/dL (ref 30.0–36.0)
MCV: 84.9 fL (ref 80.0–100.0)
Monocytes Absolute: 1.3 10*3/uL — ABNORMAL HIGH (ref 0.1–1.0)
Monocytes Relative: 11 %
Neutro Abs: 9.7 10*3/uL — ABNORMAL HIGH (ref 1.7–7.7)
Neutrophils Relative %: 78 %
Platelets: 345 10*3/uL (ref 150–400)
RBC: 4.18 MIL/uL (ref 3.87–5.11)
RDW: 14.8 % (ref 11.5–15.5)
WBC: 12.4 10*3/uL — ABNORMAL HIGH (ref 4.0–10.5)
nRBC: 0 % (ref 0.0–0.2)

## 2023-06-25 LAB — COMPREHENSIVE METABOLIC PANEL
ALT: 20 U/L (ref 0–44)
AST: 23 U/L (ref 15–41)
Albumin: 3.2 g/dL — ABNORMAL LOW (ref 3.5–5.0)
Alkaline Phosphatase: 79 U/L (ref 38–126)
Anion gap: 8 (ref 5–15)
BUN: 7 mg/dL (ref 6–20)
CO2: 24 mmol/L (ref 22–32)
Calcium: 8.6 mg/dL — ABNORMAL LOW (ref 8.9–10.3)
Chloride: 106 mmol/L (ref 98–111)
Creatinine, Ser: 0.67 mg/dL (ref 0.44–1.00)
GFR, Estimated: >60 mL/min (ref >60)
Glucose, Bld: 154 mg/dL — ABNORMAL HIGH (ref 70–99)
Potassium: 4.5 mmol/L (ref 3.5–5.1)
Sodium: 138 mmol/L (ref 135–145)
Total Bilirubin: 0.9 mg/dL (ref 0.3–1.2)
Total Protein: 6.7 g/dL (ref 6.5–8.1)

## 2023-06-25 LAB — GLUCOSE, CAPILLARY
Glucose-Capillary: 134 mg/dL — ABNORMAL HIGH (ref 70–99)
Glucose-Capillary: 102 mg/dL — ABNORMAL HIGH (ref 70–99)
Glucose-Capillary: 105 mg/dL — ABNORMAL HIGH (ref 70–99)
Glucose-Capillary: 102 mg/dL — ABNORMAL HIGH (ref 70–99)

## 2023-06-25 LAB — MAGNESIUM: Magnesium: 2.1 mg/dL (ref 1.7–2.4)

## 2023-06-25 MED ORDER — NALOXONE HCL 0.4 MG/ML IJ SOLN: 0.4000 mg | INTRAMUSCULAR | Status: DC | PRN

## 2023-06-25 MED ORDER — INSULIN ASPART 100 UNIT/ML IJ SOLN: 0.0000 [IU] | INTRAMUSCULAR | Status: DC

## 2023-06-25 MED ORDER — HYDRALAZINE HCL 20 MG/ML IJ SOLN: 5.0000 mg | Freq: Four times a day (QID) | INTRAMUSCULAR | Status: DC | PRN

## 2023-06-25 MED ORDER — MORPHINE SULFATE (PF) 2 MG/ML IV SOLN: 1.0000 mg | INTRAVENOUS | Status: DC | PRN

## 2023-06-25 MED ORDER — ACETAMINOPHEN 325 MG PO TABS: 650.0000 mg | ORAL_TABLET | Freq: Four times a day (QID) | ORAL | Status: DC | PRN

## 2023-06-25 MED ORDER — ONDANSETRON HCL 4 MG/2ML IJ SOLN: 4.0000 mg | Freq: Four times a day (QID) | INTRAMUSCULAR | Status: DC | PRN

## 2023-06-25 MED ORDER — LACTATED RINGERS IV SOLN: INTRAVENOUS | Status: AC

## 2023-06-25 MED ORDER — PANTOPRAZOLE SODIUM 40 MG IV SOLR: 40.0000 mg | INTRAVENOUS | Status: DC

## 2023-06-25 MED ORDER — DIATRIZOATE MEGLUMINE & SODIUM 66-10 % PO SOLN
90.0000 mL | Freq: Once | ORAL | Status: AC
  Administered 2023-06-25: 90 mL via ORAL
  Filled 2023-06-25: qty 90

## 2023-06-25 MED ORDER — ACETAMINOPHEN 650 MG RE SUPP: 650.0000 mg | Freq: Four times a day (QID) | RECTAL | Status: DC | PRN

## 2023-06-25 MED ORDER — HYDROMORPHONE HCL 1 MG/ML IJ SOLN
0.5000 mg | INTRAMUSCULAR | Status: DC | PRN
  Administered 2023-06-25: 0.5 mg via INTRAVENOUS
  Filled 2023-06-25: qty 0.5

## 2023-06-25 NOTE — ED Notes (Signed)
Report called to Pam, RN.

## 2023-06-25 NOTE — Progress Notes (Signed)
Carryover admission to the Day Admitter; accepted by Dr.  Antionette Char as transfer from  [facility]  to a  med-surg bed at  [facility]  for  partial SBO. Please see Dr.  Francesco Runner transfer progress note for additional details.   It appears that EDP at [facility] place consult order with on-call general surgery.  I have placed some additional preliminary admit orders via the adult multi-morbid admission order set. I have also ordered n.p.o., as needed Zofran, as needed IV Dilaudid, and continuous lactated Ringer's at 100 cc/h x 12 hours.  Additionally, I have ordered morning labs in the form of CMP, CBC, serum magnesium level.    Newton Pigg, DO Hospitalist

## 2023-06-25 NOTE — Consult Note (Signed)
Carrie Larson 1961-07-21  [id number].    A spanish interpreter was used for the entirety of this visit.   Requesting MD: Dr. Lowell Guitar Chief Complaint/Reason for Consult: SBO  HPI: Carrie Larson is a 62 y.o. female with a hx of HTN, DM2 and prior SBO who presented to the ED with abdominal pain. Patient reports 2 weeks ago she began having intermittent generalized abdominal pain, worst in the suprapubic abdomen, with associated nausea. She continues to pass flatus and have bm's. No fever or vomiting. She presented due to persistence of her symptoms.   Since presentation she has been afebrile without tachycardia or systolic hypotension. WBC 12.4. No AKI. K 4.5. CT A/P with bowel wall thickening with pericolonic inflammation adjacent to the area of prior SBR and small bowel dilation proximal to this concerning for SBO.   Hx of two prior sbo's. The first resolved with conservative measures. Her second in 2016 required surgery as noted below.   Since arrival her pain has improved. She has mild pain in the suprapubic abdomen that comes and goes. Nausea has resolved. No vomiting. Passing flatus. BM today.   Prior Abdominal Surgeries: C-section, Hysterectomy, Cholecystectomy. Ex-lap, extensive LOA, SBR x 1 for closed loop SBO by Dr. Buzzy Han in 2016. Robotic bowel incarcerated supraumbilical incisional hernia repair (ECHO Bard Polypropylene dual layer laparoscopic mesh measuring 20 cm x 15 cm) with extensive lysis of adhesions by Dr. Buzzy Han in 2018.  Last Colonoscopy: Patient reports hx of Crohn's that was dx 10+ years ago but is not on tx. Per GI note in 2016 she had a Colonoscpy on 02/2014 for screening due to hx of Colitis 20 years prior in [place]. This showed "normal TI. Grossly normal appearing colon. Random biopsies showed no evidence of colitis. Int/ext hemorrhoids noted." She just saw Dr. Octaviano Glow on 06/06/23 to arrange repeat Colonoscopy   Blood Thinners: None  ROS: ROS As above, see  hpi  History reviewed. No pertinent family history.  Past Medical History:  Diagnosis Date   Arthritis    Arthritis 02/2014   hips, elbows, shoulders.    Complication of anesthesia    " a little anesthesia has a big affect "   Diabetes mellitus without complication (HCC)    Environmental and seasonal allergies    cats & dogs also   Hypertension    Nasal polyps    Small bowel obstruction (HCC) 02/2015    Past Surgical History:  Procedure Laterality Date   ABDOMINAL HYSTERECTOMY     CESAREAN SECTION     CHOLECYSTECTOMY     CHOLECYSTECTOMY     open.    COLONOSCOPY  02/2014   Dr Norma Fredrickson at [facility] [employer] [place].  normal TI and colon except for int/ext hemorrhoids.  Random biopsies negative.    DEBRIDEMENT OF ABDOMINAL WALL ABSCESS     galactocele     left breast    Social History:  reports that she has never smoked. She has never used smokeless tobacco. She reports that she does not drink alcohol and does not use drugs.  Allergies:  Allergies  Allergen Reactions   Reglan [Metoclopramide]    Prednisone Palpitations    Medications Prior to Admission  Medication Sig Dispense Refill   docusate sodium (COLACE) 100 MG capsule Take 1 capsule (100 mg total) by mouth 2 (two) times daily. 10 capsule 0   fluticasone (FLONASE) 50 MCG/ACT nasal spray Place 2 sprays into both nostrils daily as needed for allergies or rhinitis.  lisinopril (PRINIVIL,ZESTRIL) 20 MG tablet Take 0.5 tablets (10 mg total) by mouth daily. 30 tablet 0   metFORMIN (GLUCOPHAGE) 500 MG tablet Take 500 mg by mouth 2 (two) times daily with a meal.     pantoprazole (PROTONIX) 40 MG tablet Take 1 tablet by mouth daily.  3     Physical Exam: Blood pressure 120/73, pulse 74, temperature 99.2 F (37.3 C), resp. rate 16, height 5\' 7"  (1.702 m), weight 85.1 kg, SpO2 100 %. General: pleasant, WD/WN female who is laying in bed in NAD HEENT: head is normocephalic, atraumatic.   Lungs: Respiratory effort  nonlabored Abd: Soft, mild distension, mild suprapubic ttp. Otherwise NT. No rigidity or guarding. +BS. No obvious masses, hernias, or organomegaly. Prior midline scars well healed.  Skin: warm and dry    Results for orders placed or performed during the hospital encounter of 06/24/23 (from the past 48 hour(s))  Urinalysis, Routine w reflex microscopic -Urine, Clean Catch     Status: None   Collection Time: 06/24/23  8:13 PM  Result Value Ref Range   Color, Urine YELLOW YELLOW   APPearance CLEAR CLEAR   Specific Gravity, Urine 1.020 1.005 - 1.030   pH 8.0 5.0 - 8.0   Glucose, UA NEGATIVE NEGATIVE mg/dL   Hgb urine dipstick NEGATIVE NEGATIVE   Bilirubin Urine NEGATIVE NEGATIVE   Ketones, ur NEGATIVE NEGATIVE mg/dL   Protein, ur NEGATIVE NEGATIVE mg/dL   Nitrite NEGATIVE NEGATIVE   Leukocytes,Ua NEGATIVE NEGATIVE    Comment: Microscopic not done on urines with negative protein, blood, leukocytes, nitrite, or glucose < 500 mg/dL. Performed at [facility], [address] [place], [place] [address]   CBC     Status: Abnormal   Collection Time: 06/24/23  8:13 PM  Result Value Ref Range   WBC 10.7 (H) 4.0 - 10.5 K/uL   RBC 4.35 3.87 - 5.11 MIL/uL   Hemoglobin 11.6 (L) 12.0 - 15.0 g/dL   HCT 19.1 (L) 47.8 - 29.5 %   MCV 82.5 80.0 - 100.0 fL   MCH 26.7 26.0 - 34.0 pg   MCHC 32.3 30.0 - 36.0 g/dL   RDW 62.1 30.8 - 65.7 %   Platelets 377 150 - 400 K/uL   nRBC 0.0 0.0 - 0.2 %    Comment: Performed at [facility], [address] [place], [place] [address]  Basic metabolic panel     Status: Abnormal   Collection Time: 06/24/23  8:13 PM  Result Value Ref Range   Sodium 137 135 - 145 mmol/L   Potassium 4.5 3.5 - 5.1 mmol/L   Chloride 101 98 - 111 mmol/L   CO2 27 22 - 32 mmol/L   Glucose, Bld 152 (H) 70 - 99 mg/dL    Comment: Glucose reference range applies only to samples taken after fasting for at least 8 hours.   BUN 7 6 - 20 mg/dL   Creatinine, Ser 2.95  0.44 - 1.00 mg/dL   Calcium 9.0 8.9 - 28.4 mg/dL   GFR, Estimated >13 >24 mL/min    Comment: (NOTE) Calculated using the CKD-EPI Creatinine Equation (2021)    Anion gap 9 5 - 15    Comment: Performed at [facility], [address] [place], [place] [address]  CBC with Differential/Platelet     Status: Abnormal   Collection Time: 06/25/23  3:21 AM  Result Value Ref Range   WBC 12.4 (H) 4.0 - 10.5 K/uL  RBC 4.18 3.87 - 5.11 MIL/uL   Hemoglobin 10.8 (L) 12.0 - 15.0 g/dL   HCT 04.5 (L) 40.9 - 81.1 %   MCV 84.9 80.0 - 100.0 fL   MCH 25.8 (L) 26.0 - 34.0 pg   MCHC 30.4 30.0 - 36.0 g/dL   RDW 91.4 78.2 - 95.6 %   Platelets 345 150 - 400 K/uL   nRBC 0.0 0.0 - 0.2 %   Neutrophils Relative % 78 %   Neutro Abs 9.7 (H) 1.7 - 7.7 K/uL   Lymphocytes Relative 10 %   Lymphs Abs 1.2 0.7 - 4.0 K/uL   Monocytes Relative 11 %   Monocytes Absolute 1.3 (H) 0.1 - 1.0 K/uL   Eosinophils Relative 1 %   Eosinophils Absolute 0.1 0.0 - 0.5 K/uL   Basophils Relative 0 %   Basophils Absolute 0.0 0.0 - 0.1 K/uL   Immature Granulocytes 0 %   Abs Immature Granulocytes 0.04 0.00 - 0.07 K/uL    Comment: Performed at [facility], [address], [place], [place] [address]  Comprehensive metabolic panel     Status: Abnormal   Collection Time: 06/25/23  3:21 AM  Result Value Ref Range   Sodium 138 135 - 145 mmol/L   Potassium 4.5 3.5 - 5.1 mmol/L   Chloride 106 98 - 111 mmol/L   CO2 24 22 - 32 mmol/L   Glucose, Bld 154 (H) 70 - 99 mg/dL    Comment: Glucose reference range applies only to samples taken after fasting for at least 8 hours.   BUN 7 6 - 20 mg/dL   Creatinine, Ser 6.57 0.44 - 1.00 mg/dL   Calcium 8.6 (L) 8.9 - 10.3 mg/dL   Total Protein 6.7 6.5 - 8.1 g/dL   Albumin 3.2 (L) 3.5 - 5.0 g/dL   AST 23 15 - 41 U/L   ALT 20 0 - 44 U/L   Alkaline Phosphatase 79 38 - 126 U/L   Total Bilirubin 0.9 0.3 - 1.2 mg/dL   GFR, Estimated >84 >69 mL/min    Comment:  (NOTE) Calculated using the CKD-EPI Creatinine Equation (2021)    Anion gap 8 5 - 15    Comment: Performed at [facility], [address], [place], [place] [address]  Magnesium     Status: None   Collection Time: 06/25/23  3:21 AM  Result Value Ref Range   Magnesium 2.1 1.7 - 2.4 mg/dL    Comment: Performed at [facility], [address], [place], [place] [address]   CT Renal Stone Study  Result Date: 06/24/2023 CLINICAL DATA:  Right abdominal/flank pain, dysuria, hematuria EXAM: CT ABDOMEN AND PELVIS WITHOUT CONTRAST TECHNIQUE: Multidetector CT imaging of the abdomen and pelvis was performed following the standard protocol without IV contrast. RADIATION DOSE REDUCTION: This exam was performed according to the departmental dose-optimization program which includes automated exposure control, adjustment of the mA and/or kV according to patient size and/or use of iterative reconstruction technique. COMPARISON:  None Available. FINDINGS: Lower chest: Lung bases are clear. Hepatobiliary: Unenhanced liver is unremarkable. Status post cholecystectomy. No intrahepatic or extrahepatic duct dilatation. Pancreas: Within normal limits. Spleen: Within normal limits. Adrenals/Urinary Tract: Adrenal glands are within normal limits. Kidneys are within normal limits. No renal, ureteral, or bladder calculi. No hydronephrosis. Bladder is underdistended but unremarkable. Stomach/Bowel: Stomach is within normal limits. Prior small bowel resection with suture line in the right mid/lower abdomen (series 2/image 58). Associated bowel wall thickening with  pericolonic inflammation adjacent to the suture line (series 2/image 63). Suspected tethering of adjacent loops of terminal ileum (coronal image 60). Proximal to the suture, there is small bowel dilatation with small bowel stasis (series 2/image 70). Overall, this appearance favors partial small bowel obstruction with transition/narrowing  adjacent to the suture line, likely secondary to adhesions. Given adjacent inflammatory changes, enteroenteric fistula is not excluded. Normal appendix (series 2/image 60). No colonic wall thickening or inflammatory changes. Vascular/Lymphatic: No evidence of abdominal aortic aneurysm. No suspicious abdominopelvic lymphadenopathy. Reproductive: Status post hysterectomy. Bilateral ovaries are within normal limits. Other: No abdominopelvic ascites. Postsurgical changes related to prior ventral hernia mesh repair. Musculoskeletal: Visualized osseous structures are within normal limits. IMPRESSION: Suspected partial small-bowel obstruction, favored to be secondary to adhesions near the small bowel anastomosis in the right mid/lower abdomen. Secondary wall thickening with inflammatory changes. Given this, an enteroenteric fistula is not excluded. Surgical consultation is suggested. Electronically Signed   By: Charline Bills M.D.   On: 06/24/2023 21:06    Anti-infectives (From admission, onward)    None       Assessment/Plan SBO - CT w/ with bowel wall thickening with pericolonic inflammation adjacent to the area of prior SBR and small bowel dilation proximal to this concerning for SBO. Hx of multiple prior abdominal surgeries as listed above.  - HDS without fever, tachycardia or hypotension. No peritonitis on exam. No current indication for emergency surgery - Given improvement of patients symptoms, feel we can hold off on NGT at this time. Will allow sips and chips. Discussed with patient and RN if she was to have worsening abdominal pain, distension, nausea or vomiting we would recommend NPO and NGT.  - Will proceed with SBO protocol orally.  - Keep K > 4, Mg > 2 and mobilize as able for bowel function - Hopefully patient will improve with conservative management. If patient fails to improve with conservative management, they may require exploratory surgery during admission - To note, patient  reports hx of Crohn's that was dx 10+ years ago but is not on tx. Per GI note in 2016 she had a Colonoscpy on 02/2014 for screening due to hx of Colitis 20 years prior in [place]. This showed "normal TI. Grossly normal appearing colon. Random biopsies showed no evidence of colitis. Int/ext hemorrhoids noted." She already follows with GI as an outpatient (saw [facility] 06/06/23 and is getting scheduled for a colonoscopy). [facility] is touching base with GI while she is here.  - We will follow with you.  FEN - Sips/chips as above. IVF per [facility] VTE - SCDs, okay for chem ppx from a general surgery standpoint ID - None  I reviewed nursing notes, hospitalist notes, last 24 h vitals and pain scores, last 48 h intake and output, last 24 h labs and trends, and last 24 h imaging results.  Jacinto Halim, [facility] Surgery 06/25/2023, 8:25 AM Please see Amion for pager number during day hours 7:00am-4:30pm

## 2023-06-25 NOTE — H&P (Addendum)
History and Physical    Patient: Carrie Larson FYB:[id number] DOB: 08-Apr-1961 DOA: 06/24/2023 DOS: the patient was seen and examined on 06/25/2023 PCP: Jackie Plum, MD  Patient coming from: Home  Chief Complaint:  Chief Complaint  Patient presents with   Flank Pain   HPI:  Carrie Larson is Carrie Larson 62 y.o. spanish speaking female with medical history significant of previous bowel obstructions, numerous abdominal surgeries, type 2 diabetes, and hypertension who presents to the ED for evaluation of right sided abdominal pain. She has had right mid and lower quadrant pain for the past 2 weeks that radiates to the right flank and has gotten progressively worse. It is constant. In the ED the pain was 10 out of 10. She endorses nausea but no vomiting. She has been having small and hard bowel movements. No diarrhea. No fevers but does endorse chills. She reports burning with urination, that started at the same time as her abdominal pain two weeks ago. She states her symptoms feel similar to the last 2 bowel obstructions she's had.   ED Course  CT renal stone study shows suspected partial small-bowel obstruction, favored to be secondary to adhesions near the small bowel anastomosis in the right mid/lower abdomen. Secondary wall thickening with inflammatory changes. Given this, an enteroenteric fistula is not excluded.  Leukocytosis of 10.7, hemoglobin of 11.6 possibly dilutional in setting of IVF resuscitation, and negative urinalysis  EDP spoke with Dr. Magnus Ivan of general surgery who did not recommend an NG tube at that time because the patient has not vomited. General Surgery will see M(r)s Ollis today in consult.  This morning when evaluated she is passing flatus, reports 3/10 (R) abdominal pain, and is non-toxic appearing.  Review of Systems: As mentioned in the history of present illness. All other systems reviewed and are negative. Past Medical History:  Diagnosis Date   Arthritis     Arthritis 02/2014   hips, elbows, shoulders.    Complication of anesthesia    " Danyla Wattley little anesthesia has Norbert Malkin big affect "   Diabetes mellitus without complication (HCC)    Environmental and seasonal allergies    cats & dogs also   Hypertension    Nasal polyps    Small bowel obstruction (HCC) 02/2015   Past Surgical History:  Procedure Laterality Date   ABDOMINAL HYSTERECTOMY     CESAREAN SECTION     CHOLECYSTECTOMY     CHOLECYSTECTOMY     open.    COLONOSCOPY  02/2014   Dr Norma Fredrickson at [place], [place].  normal TI and colon except for int/ext hemorrhoids.  Random biopsies negative.    DEBRIDEMENT OF ABDOMINAL WALL ABSCESS     galactocele     left breast   Social History:  reports that she has never smoked. She has never used smokeless tobacco. She reports that she does not drink alcohol and does not use drugs.  Allergies  Allergen Reactions   Reglan [Metoclopramide]    Prednisone Palpitations    History reviewed. No pertinent family history.  Prior to Admission medications   Medication Sig Start Date End Date Taking? Authorizing Provider  docusate sodium (COLACE) 100 MG capsule Take 1 capsule (100 mg total) by mouth 2 (two) times daily. 03/27/15   Richarda Overlie, MD  fluticasone (FLONASE) 50 MCG/ACT nasal spray Place 2 sprays into both nostrils daily as needed for allergies or rhinitis.    [provider]  lisinopril (PRINIVIL,ZESTRIL) 20 MG tablet Take 0.5 tablets (10 mg total) by  mouth daily. 03/11/15   Albertine Grates, MD  metFORMIN (GLUCOPHAGE) 500 MG tablet Take 500 mg by mouth 2 (two) times daily with Brin Ruggerio meal.    [provider]  pantoprazole (PROTONIX) 40 MG tablet Take 1 tablet by mouth daily. 03/15/15   [provider]    Physical Exam: Vitals:   06/25/23 0000 06/25/23 0046 06/25/23 0057 06/25/23 0532  BP: (!) 106/50  117/66 120/73  Pulse: 64  68 74  Resp: 18  19 16   Temp: 99.4 F (37.4 C)  98.8 F (37.1 C) 99.2 F (37.3 C)  TempSrc: Oral      SpO2: 96%  100% 100%  Weight:  85.1 kg    Height:  5\' 7"  (1.702 m)     Constitutional: NAD, calm, comfortable Eyes: PERRL, lids and conjunctivae normal ENMT: Mucous membranes are moist. Posterior pharynx clear of any exudate or lesions.  Neck: normal, supple, no masses, no thyromegaly Respiratory: clear to auscultation bilaterally, no wheezing, no crackles. Normal respiratory effort. No accessory muscle use.  Cardiovascular: Regular rate and rhythm, no murmurs / rubs / gallops. No extremity edema. 2+ pedal pulses. No carotid bruits.  Abdomen: RLQ tenderness, non-distended abdomen, no masses palpated. No hepatosplenomegaly. Bowel sounds active. Midline surgical scar. Musculoskeletal: no clubbing / cyanosis. No joint deformity upper and lower extremities. Good ROM, no contractures. Normal muscle tone.  Skin: no rashes, lesions, ulcers. (R) lower arm/wrist scarring. Neurologic: CN 2-12 grossly intact. Sensation intact.  Strength 5/5 x all 4 extremities.  Psychiatric: Normal judgment and insight. Alert and oriented x 3. Normal mood.   Data Reviewed: CBC    Component Value Date/Time   WBC 12.4 (H) 06/25/2023 0321   RBC 4.18 06/25/2023 0321   HGB 10.8 (L) 06/25/2023 0321   HCT 35.5 (L) 06/25/2023 0321   PLT 345 06/25/2023 0321   MCV 84.9 06/25/2023 0321   MCH 25.8 (L) 06/25/2023 0321   MCHC 30.4 06/25/2023 0321   RDW 14.8 06/25/2023 0321   LYMPHSABS 1.2 06/25/2023 0321   MONOABS 1.3 (H) 06/25/2023 0321   EOSABS 0.1 06/25/2023 0321   BASOSABS 0.0 06/25/2023 0321   CMP     Component Value Date/Time   NA 138 06/25/2023 0321   K 4.5 06/25/2023 0321   CL 106 06/25/2023 0321   CO2 24 06/25/2023 0321   GLUCOSE 154 (H) 06/25/2023 0321   BUN 7 06/25/2023 0321   CREATININE 0.67 06/25/2023 0321   CALCIUM 8.6 (L) 06/25/2023 0321   PROT 6.7 06/25/2023 0321   ALBUMIN 3.2 (L) 06/25/2023 0321   AST 23 06/25/2023 0321   ALT 20 06/25/2023 0321   ALKPHOS 79 06/25/2023 0321   BILITOT 0.9  06/25/2023 0321   GFRNONAA >60 06/25/2023 0321   Magnesium    Component Value Date/Time   MAGNESIUM 2.1 06/25/2023 0321   Urinalysis    Component Value Date/Time   COLORURINE YELLOW 06/24/2023 2013   APPEARANCEUR CLEAR 06/24/2023 2013   LABSPEC 1.020 06/24/2023 2013   PHURINE 8.0 06/24/2023 2013   GLUCOSEU NEGATIVE 06/24/2023 2013   HGBUR NEGATIVE 06/24/2023 2013   BILIRUBINUR NEGATIVE 06/24/2023 2013   KETONESUR NEGATIVE 06/24/2023 2013   PROTEINUR NEGATIVE 06/24/2023 2013   UROBILINOGEN 1.0 03/26/2015 0015   NITRITE NEGATIVE 06/24/2023 2013   LEUKOCYTESUR NEGATIVE 06/24/2023 2013    CT Renal Stone Study  Result Date: 06/24/2023 CLINICAL DATA:  Right abdominal/flank pain, dysuria, hematuria EXAM: CT ABDOMEN AND PELVIS WITHOUT CONTRAST TECHNIQUE: Multidetector CT imaging of the abdomen and  pelvis was performed following the standard protocol without IV contrast. RADIATION DOSE REDUCTION: This exam was performed according to the departmental dose-optimization program which includes automated exposure control, adjustment of the mA and/or kV according to patient size and/or use of iterative reconstruction technique. COMPARISON:  None Available. FINDINGS: Lower chest: Lung bases are clear. Hepatobiliary: Unenhanced liver is unremarkable. Status post cholecystectomy. No intrahepatic or extrahepatic duct dilatation. Pancreas: Within normal limits. Spleen: Within normal limits. Adrenals/Urinary Tract: Adrenal glands are within normal limits. Kidneys are within normal limits. No renal, ureteral, or bladder calculi. No hydronephrosis. Bladder is underdistended but unremarkable. Stomach/Bowel: Stomach is within normal limits. Prior small bowel resection with suture line in the right mid/lower abdomen (series 2/image 58). Associated bowel wall thickening with pericolonic inflammation adjacent to the suture line (series 2/image 63). Suspected tethering of adjacent loops of terminal ileum (coronal image  60). Proximal to the suture, there is small bowel dilatation with small bowel stasis (series 2/image 70). Overall, this appearance favors partial small bowel obstruction with transition/narrowing adjacent to the suture line, likely secondary to adhesions. Given adjacent inflammatory changes, enteroenteric fistula is not excluded. Normal appendix (series 2/image 60). No colonic wall thickening or inflammatory changes. Vascular/Lymphatic: No evidence of abdominal aortic aneurysm. No suspicious abdominopelvic lymphadenopathy. Reproductive: Status post hysterectomy. Bilateral ovaries are within normal limits. Other: No abdominopelvic ascites. Postsurgical changes related to prior ventral hernia mesh repair. Musculoskeletal: Visualized osseous structures are within normal limits. IMPRESSION: Suspected partial small-bowel obstruction, favored to be secondary to adhesions near the small bowel anastomosis in the right mid/lower abdomen. Secondary wall thickening with inflammatory changes. Given this, an enteroenteric fistula is not excluded. Surgical consultation is suggested. Electronically Signed   By: Charline Bills M.D.   On: 06/24/2023 21:06     Assessment and Plan: #Partial Small Bowel Obstruction CT Renal Stone study suggestive of partial small bowel obstruction and secondary wall thickening and possible enteroenteric fistula. She does have Suzi Hernan history of prior bowel obstructions and numerous abdominal surgeries. She reports previous hernia repair and bowel resection. Patient does not currently have signs/symptoms suggestive of bowel ischemia or peritonitis. WBC 12.4, afebrile, HR 74. - Keep NPO (small amount of ice chips for patient comfort OK)  - NGT deferred per surgery as patient has not had any vomiting. Last bowel movement was yesterday and she was passing flatus when I saw her at 0800. - MIVF at 100 mL/hr - Daily BMP + Mg,  - Replete K and Mg with goal K>4, Mg >2 - PRN Analgesia  - PRN  antiemetics - General surgery consulted, appreciate their recommendations.  - GI consulted due to reported history of crohn's per patient, Dr. Dulce Sellar to see  #Type II Diabetes Mellitus - CBG - SSI  #Hypertension - PRN Hydralazine while NPO  VTE prophylaxis: SCDs GI prophylaxis: Protonix Diet: NPO Access: PIV Lines: NONE Telemetry: No Disposition: Admit to med-surg   Advance Care Planning:   Code Status: Full Code   Consults: General Surgery  Family Communication: No family at bedside. States husband will be here this morning and does not want me to call.  Severity of Illness: The appropriate patient status for this patient is INPATIENT. Inpatient status is judged to be reasonable and necessary in order to provide the required intensity of service to ensure the patient's safety. The patient's presenting symptoms, physical exam findings, and initial radiographic and laboratory data in the context of their chronic comorbidities is felt to place them at high risk for further  clinical deterioration. Furthermore, it is not anticipated that the patient will be medically stable for discharge from the hospital within 2 midnights of admission.   * I certify that at the point of admission it is my clinical judgment that the patient will require inpatient hospital care spanning beyond 2 midnights from the point of admission due to high intensity of service, high risk for further deterioration and high frequency of surveillance required.*  To reach the provider On-Call:   7AM- 7PM see care teams to locate the attending and reach out to them via [email or web address] Password: [email or web address] 7PM-7AM contact night-coverage If you still have difficulty reaching the appropriate provider, please page the [facility] (Director on Call) for [facility] on amion for assistance  This document was prepared using Conservation officer, historic buildings and may include unintentional dictation errors.  Bishop Limbo FNP-BC,  PMHNP-BC Nurse Practitioner [facility]

## 2023-06-25 NOTE — TOC Progression Note (Signed)
Transition of Care [facility]) - Progression Note    Patient Details  Name: Carrie Larson MRN: [id number] Date of Birth: 10/07/1961  Transition of Care [facility]) CM/SW Contact  Coralyn Helling, [place] Phone Number: 06/25/2023, 11:20 AM  Clinical Narrative:     Transition of Care [facility]) - Inpatient Brief Assessment   Patient Details  Name: Carrie Larson MRN: [id number] Date of Birth: Sep 12, 1961  Transition of Care [facility]) CM/SW Contact:    Coralyn Helling, LCSW Phone Number: 06/25/2023, 11:20 AM   Clinical Narrative:  No needs identified at time of screening.  Transition of Care Asessment: Insurance and Status: Insurance coverage has been reviewed Patient has primary care physician: Yes Home environment has been reviewed: Home with spouse Prior level of function:: Independent Prior/Current Home Services: No current home services Social Determinants of Health Reivew: SDOH reviewed no interventions necessary Readmission risk has been reviewed: Yes Transition of care needs: no transition of care needs at this time        Expected Discharge Plan and Services                                               Social Determinants of Health (SDOH) Interventions SDOH Screenings   Food Insecurity: No Food Insecurity (06/25/2023)  Housing: Low Risk  (06/25/2023)  Transportation Needs: No Transportation Needs (06/25/2023)  Utilities: Not At Risk (06/25/2023)  Tobacco Use: Low Risk  (06/24/2023)    Readmission Risk Interventions     No data to display

## 2023-06-25 NOTE — Progress Notes (Signed)
Dr Lowell Guitar sent RN message to give pt pain medication. Day RN and Night RN went into pts room to assess pain. Pt refused pain meds at this time. Pt reports abdominal pain with BMs. Pt agrees to make night RN aware if pain increases and needs pain med.

## 2023-06-26 LAB — GLUCOSE, CAPILLARY
Glucose-Capillary: 108 mg/dL — ABNORMAL HIGH (ref 70–99)
Glucose-Capillary: 138 mg/dL — ABNORMAL HIGH (ref 70–99)
Glucose-Capillary: 81 mg/dL (ref 70–99)
Glucose-Capillary: 85 mg/dL (ref 70–99)
Glucose-Capillary: 86 mg/dL (ref 70–99)

## 2023-06-26 LAB — CBC
HCT: 33.1 % — ABNORMAL LOW (ref 36.0–46.0)
Hemoglobin: 10.2 g/dL — ABNORMAL LOW (ref 12.0–15.0)
MCH: 26.3 pg (ref 26.0–34.0)
MCHC: 30.8 g/dL (ref 30.0–36.0)
MCV: 85.3 fL (ref 80.0–100.0)
Platelets: 297 10*3/uL (ref 150–400)
RBC: 3.88 MIL/uL (ref 3.87–5.11)
RDW: 14.8 % (ref 11.5–15.5)
WBC: 5.8 10*3/uL (ref 4.0–10.5)
nRBC: 0 % (ref 0.0–0.2)

## 2023-06-26 LAB — BASIC METABOLIC PANEL
Anion gap: 7 (ref 5–15)
BUN: 10 mg/dL (ref 6–20)
CO2: 26 mmol/L (ref 22–32)
Calcium: 8.6 mg/dL — ABNORMAL LOW (ref 8.9–10.3)
Chloride: 107 mmol/L (ref 98–111)
Creatinine, Ser: 0.6 mg/dL (ref 0.44–1.00)
GFR, Estimated: >60 mL/min (ref >60)
Glucose, Bld: 97 mg/dL (ref 70–99)
Potassium: 4.4 mmol/L (ref 3.5–5.1)
Sodium: 140 mmol/L (ref 135–145)

## 2023-06-26 LAB — MAGNESIUM: Magnesium: 2 mg/dL (ref 1.7–2.4)

## 2023-06-26 MED ORDER — PANTOPRAZOLE SODIUM 40 MG PO TBEC
40.0000 mg | DELAYED_RELEASE_TABLET | Freq: Every day | ORAL | Status: DC
  Administered 2023-06-26: 40 mg via ORAL
  Filled 2023-06-26: qty 1

## 2023-06-26 NOTE — Discharge Summary (Signed)
Physician Discharge Summary   Patient: Carrie Larson MRN: [id number] DOB: 07/03/1961  Admit date:     06/24/2023  Discharge date: 06/26/23  Discharge Physician: Meredeth Ide   PCP: Jackie Plum, MD   Recommendations at discharge:   Follow-up gastroenterology as outpatient for repeat colonoscopy Follow-up PCP in 1 week  Discharge Diagnoses: Principal Problem:   Partial small bowel obstruction (HCC)  Resolved Problems:   * No resolved hospital problems. *  Hospital Course: 62 y.o. spanish speaking female with medical history significant of previous bowel obstructions, numerous abdominal surgeries, type 2 diabetes, and hypertension who presents to the ED for evaluation of right sided abdominal pain. She has had right mid and lower quadrant pain for the past 2 weeks that radiates to the right flank and has gotten progressively worse.  CT renal stone study shows suspected partial small-bowel obstruction, favored to be secondary to adhesions near the small bowel anastomosis in the right mid/lower abdomen. Secondary wall thickening with inflammatory changes. Given this, an enteroenteric fistula is not excluded.   Assessment and Plan:  Partial small bowel obstruction -Resolved -She was seen in both gastroenterology and general surgery, both have signed off.  Cleared for discharge -Tolerating diet well  History of Crohn's disease -Patient will follow-up with Dr. Dulce Sellar as outpatient for colonoscopy        Consultants: Gastroenterology, general surgery Procedures performed:   Disposition: Home Diet recommendation:  Discharge Diet Orders (From admission, onward)     Start     Ordered   06/26/23 0000  Diet - low sodium heart healthy        06/26/23 1204           Regular diet DISCHARGE MEDICATION: Allergies as of 06/26/2023       Reactions   Reglan [metoclopramide]    Prednisone Palpitations        Medication List     STOP taking these medications     sucralfate 1 GM/10ML suspension Commonly known as: CARAFATE       TAKE these medications    albuterol 108 (90 Base) MCG/ACT inhaler Commonly known as: VENTOLIN HFA Inhale 2 puffs into the lungs every 6 (six) hours as needed for shortness of breath.   docusate sodium 100 MG capsule Commonly known as: Colace Take 1 capsule (100 mg total) by mouth 2 (two) times daily.   fluticasone 50 MCG/ACT nasal spray Commonly known as: FLONASE Place 2 sprays into both nostrils daily as needed for allergies or rhinitis.   glimepiride 2 MG tablet Commonly known as: AMARYL Take 2 mg by mouth 2 (two) times daily.   Lidocaine-Hydrocort (Perianal) 3-0.5 % Crea Place 1 Application rectally 2 (two) times daily.   pantoprazole 40 MG tablet Commonly known as: PROTONIX Take 1 tablet by mouth daily.   PreviDent 0.2 % Soln Generic drug: SODIUM FLUORIDE (DENTAL RINSE) Take 1 Application by mouth as directed.   Trulicity 0.75 MG/0.5ML Sopn Generic drug: Dulaglutide Inject 0.75 mg into the skin once a week.   Vitamin D3 25 MCG (1000 UT) Caps Take 1 capsule by mouth daily.        Follow-up Information     Willis Modena, MD. Schedule an appointment as soon as possible for a visit.   Specialty: Gastroenterology Contact information: [address] [place] [address] [phone]                Discharge Exam: Ceasar Mons Weights   06/24/23 2010 06/25/23 0046  Weight: 86.2 kg 85.1 kg   General-appears in no acute distress Heart-S1-S2, regular, no murmur auscultated Lungs-clear to auscultation bilaterally, no wheezing or crackles auscultated Abdomen-soft, nontender, no organomegaly Extremities-no edema in the lower extremities Neuro-alert, oriented x3, no focal deficit noted  Condition at discharge: good  The results of significant diagnostics from this hospitalization (including imaging, microbiology, ancillary and laboratory) are listed below for reference.    Imaging Studies: DG Abd Portable 1V-Small Bowel Obstruction Protocol-initial, 8 hr delay  Result Date: 06/25/2023 CLINICAL DATA:  8 hour delay bowel obstruction EXAM: PORTABLE ABDOMEN - 1 VIEW COMPARISON:  CT 06/24/2023 FINDINGS: Enteral contrast is present in the colon and rectum. Previous hernia repair. Mild air distension of a few upper abdominal bowel loops. IMPRESSION: Enteral contrast is present in the colon and rectum. Electronically Signed   By: Jasmine Pang M.D.   On: 06/25/2023 22:19   CT Renal Stone Study  Result Date: 06/24/2023 CLINICAL DATA:  Right abdominal/flank pain, dysuria, hematuria EXAM: CT ABDOMEN AND PELVIS WITHOUT CONTRAST TECHNIQUE: Multidetector CT imaging of the abdomen and pelvis was performed following the standard protocol without IV contrast. RADIATION DOSE REDUCTION: This exam was performed according to the departmental dose-optimization program which includes automated exposure control, adjustment of the mA and/or kV according to patient size and/or use of iterative reconstruction technique. COMPARISON:  None Available. FINDINGS: Lower chest: Lung bases are clear. Hepatobiliary: Unenhanced liver is unremarkable. Status post cholecystectomy. No intrahepatic or extrahepatic duct dilatation. Pancreas: Within normal limits. Spleen: Within normal limits. Adrenals/Urinary Tract: Adrenal glands are within normal limits. Kidneys are within normal limits. No renal, ureteral, or bladder calculi. No hydronephrosis. Bladder is underdistended but unremarkable. Stomach/Bowel: Stomach is within normal limits. Prior small bowel resection with suture line in the right mid/lower abdomen (series 2/image 58). Associated bowel wall thickening with pericolonic inflammation adjacent to the suture line (series 2/image 63). Suspected tethering of adjacent loops of terminal ileum (coronal image 60). Proximal to the suture, there is small bowel dilatation with small bowel stasis (series 2/image 70).  Overall, this appearance favors partial small bowel obstruction with transition/narrowing adjacent to the suture line, likely secondary to adhesions. Given adjacent inflammatory changes, enteroenteric fistula is not excluded. Normal appendix (series 2/image 60). No colonic wall thickening or inflammatory changes. Vascular/Lymphatic: No evidence of abdominal aortic aneurysm. No suspicious abdominopelvic lymphadenopathy. Reproductive: Status post hysterectomy. Bilateral ovaries are within normal limits. Other: No abdominopelvic ascites. Postsurgical changes related to prior ventral hernia mesh repair. Musculoskeletal: Visualized osseous structures are within normal limits. IMPRESSION: Suspected partial small-bowel obstruction, favored to be secondary to adhesions near the small bowel anastomosis in the right mid/lower abdomen. Secondary wall thickening with inflammatory changes. Given this, an enteroenteric fistula is not excluded. Surgical consultation is suggested. Electronically Signed   By: Charline Bills M.D.   On: 06/24/2023 21:06    Microbiology: Results for orders placed or performed during the hospital encounter of 03/07/15  Culture, Urine     Status: None   Collection Time: 03/08/15  4:44 PM   Specimen: Urine, Clean Catch  Result Value Ref Range Status   Specimen Description URINE, CLEAN CATCH  Final   Special Requests Normal  Final   Colony Count NO GROWTH Performed at [facility]   Final   Culture NO GROWTH Performed at [facility]   Final   Report Status 03/09/2015 FINAL  Final  Clostridium Difficile by PCR     Status: None   Collection Time: 03/09/15  9:04 AM   Specimen: Stool  Result Value Ref Range Status   Toxigenic C. Difficile by PCR NEGATIVE NEGATIVE Final    Labs: CBC: Recent Labs  Lab 06/24/23 2013 06/25/23 0321 06/26/23 0341  WBC 10.7* 12.4* 5.8  NEUTROABS  --  9.7*  --   HGB 11.6* 10.8* 10.2*  HCT 35.9* 35.5* 33.1*  MCV 82.5 84.9 85.3   PLT 377 345 297   Basic Metabolic Panel: Recent Labs  Lab 06/24/23 2013 06/25/23 0321 06/26/23 0341  NA 137 138 140  K 4.5 4.5 4.4  CL 101 106 107  CO2 27 24 26   GLUCOSE 152* 154* 97  BUN 7 7 10   CREATININE 0.75 0.67 0.60  CALCIUM 9.0 8.6* 8.6*  MG  --  2.1 2.0   Liver Function Tests: Recent Labs  Lab 06/25/23 0321  AST 23  ALT 20  ALKPHOS 79  BILITOT 0.9  PROT 6.7  ALBUMIN 3.2*   CBG: Recent Labs  Lab 06/25/23 2022 06/26/23 0013 06/26/23 0521 06/26/23 0602 06/26/23 0858  GLUCAP 102* 108* 86 85 81    Discharge time spent: greater than 30 minutes.  Signed: Meredeth Ide, MD [facility] 06/26/2023

## 2023-06-26 NOTE — Progress Notes (Signed)
Subjective: CC: Spanish interpreter used for the entirety of this visit.   Abdominal pain has resolved. Tolerating clears without n/v. Passing flatus. BM yesterday. No PRN pain or anti-nausea medications since I saw her yesterday. Xray with contrast in colon. WBC has normalized.   Objective: Vital signs in last 24 hours: Temp:  [97.9 F (36.6 C)-98.9 F (37.2 C)] 97.9 F (36.6 C) (07/03 0605) Pulse Rate:  [58-62] 58 (07/03 0605) Resp:  [18] 18 (07/03 0605) BP: (102-117)/(49-67) 102/56 (07/03 0605) SpO2:  [99 %-100 %] 100 % (07/03 0605) Last BM Date : 06/25/23  Intake/Output from previous day: 07/02 0701 - 07/03 0700 In: 802.6 [P.O.:30; I.V.:772.6] Out: -  Intake/Output this shift: No intake/output data recorded.  PE: Gen:  Alert, NAD, pleasant Abd: Soft, ND, very mild suprapubic ttp without rigidity or guarding. Otherwise completely NT. +BS  Lab Results:  Recent Labs    06/25/23 0321 06/26/23 0341  WBC 12.4* 5.8  HGB 10.8* 10.2*  HCT 35.5* 33.1*  PLT 345 297   BMET Recent Labs    06/25/23 0321 06/26/23 0341  NA 138 140  K 4.5 4.4  CL 106 107  CO2 24 26  GLUCOSE 154* 97  BUN 7 10  CREATININE 0.67 0.60  CALCIUM 8.6* 8.6*   PT/INR No results for input(s): "LABPROT", "INR" in the last 72 hours. CMP     Component Value Date/Time   NA 140 06/26/2023 0341   K 4.4 06/26/2023 0341   CL 107 06/26/2023 0341   CO2 26 06/26/2023 0341   GLUCOSE 97 06/26/2023 0341   BUN 10 06/26/2023 0341   CREATININE 0.60 06/26/2023 0341   CALCIUM 8.6 (L) 06/26/2023 0341   PROT 6.7 06/25/2023 0321   ALBUMIN 3.2 (L) 06/25/2023 0321   AST 23 06/25/2023 0321   ALT 20 06/25/2023 0321   ALKPHOS 79 06/25/2023 0321   BILITOT 0.9 06/25/2023 0321   GFRNONAA >60 06/26/2023 0341   GFRAA >90 03/27/2015 0900   Lipase     Component Value Date/Time   LIPASE 14 03/26/2015 0013    Studies/Results: DG Abd Portable 1V-Small Bowel Obstruction Protocol-initial, 8 hr  delay  Result Date: 06/25/2023 CLINICAL DATA:  8 hour delay bowel obstruction EXAM: PORTABLE ABDOMEN - 1 VIEW COMPARISON:  CT 06/24/2023 FINDINGS: Enteral contrast is present in the colon and rectum. Previous hernia repair. Mild air distension of a few upper abdominal bowel loops. IMPRESSION: Enteral contrast is present in the colon and rectum. Electronically Signed   By: Jasmine Pang M.D.   On: 06/25/2023 22:19   CT Renal Stone Study  Result Date: 06/24/2023 CLINICAL DATA:  Right abdominal/flank pain, dysuria, hematuria EXAM: CT ABDOMEN AND PELVIS WITHOUT CONTRAST TECHNIQUE: Multidetector CT imaging of the abdomen and pelvis was performed following the standard protocol without IV contrast. RADIATION DOSE REDUCTION: This exam was performed according to the departmental dose-optimization program which includes automated exposure control, adjustment of the mA and/or kV according to patient size and/or use of iterative reconstruction technique. COMPARISON:  None Available. FINDINGS: Lower chest: Lung bases are clear. Hepatobiliary: Unenhanced liver is unremarkable. Status post cholecystectomy. No intrahepatic or extrahepatic duct dilatation. Pancreas: Within normal limits. Spleen: Within normal limits. Adrenals/Urinary Tract: Adrenal glands are within normal limits. Kidneys are within normal limits. No renal, ureteral, or bladder calculi. No hydronephrosis. Bladder is underdistended but unremarkable. Stomach/Bowel: Stomach is within normal limits. Prior small bowel resection with suture line in the right mid/lower abdomen (series 2/image  58). Associated bowel wall thickening with pericolonic inflammation adjacent to the suture line (series 2/image 63). Suspected tethering of adjacent loops of terminal ileum (coronal image 60). Proximal to the suture, there is small bowel dilatation with small bowel stasis (series 2/image 70). Overall, this appearance favors partial small bowel obstruction with  transition/narrowing adjacent to the suture line, likely secondary to adhesions. Given adjacent inflammatory changes, enteroenteric fistula is not excluded. Normal appendix (series 2/image 60). No colonic wall thickening or inflammatory changes. Vascular/Lymphatic: No evidence of abdominal aortic aneurysm. No suspicious abdominopelvic lymphadenopathy. Reproductive: Status post hysterectomy. Bilateral ovaries are within normal limits. Other: No abdominopelvic ascites. Postsurgical changes related to prior ventral hernia mesh repair. Musculoskeletal: Visualized osseous structures are within normal limits. IMPRESSION: Suspected partial small-bowel obstruction, favored to be secondary to adhesions near the small bowel anastomosis in the right mid/lower abdomen. Secondary wall thickening with inflammatory changes. Given this, an enteroenteric fistula is not excluded. Surgical consultation is suggested. Electronically Signed   By: Charline Bills M.D.   On: 06/24/2023 21:06    Anti-infectives: Anti-infectives (From admission, onward)    None      Prior Abdominal Surgeries: C-section, Hysterectomy, Cholecystectomy. Ex-lap, extensive LOA, SBR x 1 for closed loop SBO by Dr. Buzzy Han in 2016. Robotic bowel incarcerated supraumbilical incisional hernia repair (ECHO Bard Polypropylene dual layer laparoscopic mesh measuring 20 cm x 15 cm) with extensive lysis of adhesions by Dr. Buzzy Han in 2018.   Assessment/Plan SBO - CT w/ with bowel wall thickening with pericolonic inflammation adjacent to the area of prior SBR and small bowel dilation proximal to this concerning for SBO. Hx of multiple prior abdominal surgeries as noted above.  - To note, patient reports hx of Crohn's that was dx 10+ years ago but is not on tx. Per GI note in 2016 she had a Colonoscpy on 02/2014 for screening due to hx of Colitis 20 years prior in [place]. This showed "normal TI. Grossly normal appearing colon. Random biopsies showed no evidence  of colitis. Int/ext hemorrhoids noted." She already follows with GI as an outpatient (saw [facility] 06/06/23 and is getting scheduled for a colonoscopy). [facility] has consulted GI already.  - No indication for emergency surgery - Patient appears to be clinically and radiographically resolving. She has contrast in colon on xray, resolution of her symptoms, tolerating cld without n/v, having bowel function, and reassuring exam as above. Can advance diet as tolerated. If tolerates diet advancement, she is okay for discharge from our standpoint.    FEN - FLD. ADAT to soft diet. IVF per [facility] VTE - SCDs, okay for chem ppx from a general surgery standpoint ID - None  I reviewed nursing notes, hospitalist notes, last 24 h vitals and pain scores, last 48 h intake and output, last 24 h labs and trends, and last 24 h imaging results.    LOS: 1 day    Jacinto Halim , [facility] 06/26/2023, 8:49 AM Please see Amion for pager number during day hours 7:00am-4:30pm

## 2023-06-26 NOTE — Consult Note (Signed)
[facility] Consultation Note  Referring Provider: [facility] Primary Care Physician:  Jackie Plum, MD Primary Gastroenterologist: Gentry Fitz  Reason for Consultation:  small bowel obstruction  HPI: Carrie Larson is a 62 y.o. female admitted abdominal pain, distention, nausea, vomiting.  [employer] video system, interpreter Sullivan Lone, utilized for [employer].  Patient reports feeling better now, with no nausea or vomiting, and return of bowel function.  No blood in stool.  She reports diagnosed with Crohn's disease about 9 years ago, with no further GI follow-up.  Sounds like she has never been on Crohn's treatment.  There is report  in chart of patient having seen Dr. Benna Dunks in June to arrange repeat colonoscopy, but patient doesn't mention this or seem to be aware of it.   Past Medical History:  Diagnosis Date   Arthritis    Arthritis 02/2014   hips, elbows, shoulders.    Complication of anesthesia    " a little anesthesia has a big affect "   Diabetes mellitus without complication (HCC)    Environmental and seasonal allergies    cats & dogs also   Hypertension    Nasal polyps    Small bowel obstruction (HCC) 02/2015    Past Surgical History:  Procedure Laterality Date   ABDOMINAL HYSTERECTOMY     CESAREAN SECTION     CHOLECYSTECTOMY     CHOLECYSTECTOMY     open.    COLONOSCOPY  02/2014   Dr Norma Fredrickson at [place].  normal TI and colon except for int/ext hemorrhoids.  Random biopsies negative.    DEBRIDEMENT OF ABDOMINAL WALL ABSCESS     galactocele     left breast    Prior to Admission medications   Medication Sig Start Date End Date Taking? Authorizing Provider  albuterol (VENTOLIN HFA) 108 (90 Base) MCG/ACT inhaler Inhale 2 puffs into the lungs every 6 (six) hours as needed for shortness of breath. 10/11/20  Yes [provider]  Cholecalciferol (VITAMIN D3) 25 MCG (1000 UT) CAPS Take 1 capsule by mouth daily. 05/23/23   Yes [provider]  docusate sodium (COLACE) 100 MG capsule Take 1 capsule (100 mg total) by mouth 2 (two) times daily. 03/27/15  Yes Richarda Overlie, MD  Dulaglutide (TRULICITY) 0.75 MG/0.5ML SOPN Inject 0.75 mg into the skin once a week.   Yes [provider]  fluticasone (FLONASE) 50 MCG/ACT nasal spray Place 2 sprays into both nostrils daily as needed for allergies or rhinitis.   Yes [provider]  glimepiride (AMARYL) 2 MG tablet Take 2 mg by mouth 2 (two) times daily.   Yes [provider]  Lidocaine-Hydrocort, Perianal, 3-0.5 % CREA Place 1 Application rectally 2 (two) times daily.   Yes [provider]  pantoprazole (PROTONIX) 40 MG tablet Take 1 tablet by mouth daily. 03/15/15  Yes [provider]  PREVIDENT 0.2 % SOLN Take 1 Application by mouth as directed. 05/08/23  Yes [provider]  sucralfate (CARAFATE) 1 GM/10ML suspension Take 1 g by mouth 4 (four) times daily.   Yes [provider]    Current Facility-Administered Medications  Medication Dose Route Frequency Provider Last Rate Last Admin   acetaminophen (TYLENOL) tablet 650 mg  650 mg Oral Q6H PRN Howerter, Justin B, DO       Or   acetaminophen (TYLENOL) suppository 650 mg  650 mg Rectal Q6H PRN Howerter, Justin B, DO       HYDROmorphone (DILAUDID) injection 0.5 mg  0.5 mg Intravenous  Q2H PRN Howerter, Justin B, DO   0.5 mg at 06/25/23 0135   insulin aspart (novoLOG) injection 0-15 Units  0-15 Units Subcutaneous Q4H Foust, Katy L, NP       morphine (PF) 2 MG/ML injection 1 mg  1 mg Intravenous Q4H PRN Foust, Katy L, NP       naloxone (NARCAN) injection 0.4 mg  0.4 mg Intravenous PRN Howerter, Justin B, DO       ondansetron (ZOFRAN) injection 4 mg  4 mg Intravenous Q6H PRN Howerter, Justin B, DO       pantoprazole (PROTONIX) EC tablet 40 mg  40 mg Oral Daily Norva Pavlov, RPH   40 mg at 06/26/23 1610    Allergies as of 06/24/2023 - Review Complete  06/24/2023  Allergen Reaction Noted   Reglan [metoclopramide]  08/28/2013   Prednisone Palpitations 03/26/2015    History reviewed. No pertinent family history.  Social History   Socioeconomic History   Marital status: Married    Spouse name: Not on file   Number of children: Not on file   Years of education: Not on file   Highest education level: Not on file  Occupational History   Not on file  Tobacco Use   Smoking status: Never   Smokeless tobacco: Never  Substance and Sexual Activity   Alcohol use: No   Drug use: No   Sexual activity: Not on file  Other Topics Concern   Not on file  Social History Narrative   ** Merged History Encounter **       Social Determinants of Health   Financial Resource Strain: Not on file  Food Insecurity: No Food Insecurity (06/25/2023)   Hunger Vital Sign    Worried About Running Out of Food in the Last Year: Never true    Ran Out of Food in the Last Year: Never true  Transportation Needs: No Transportation Needs (06/25/2023)   PRAPARE - Administrator, Civil Service (Medical): No    Lack of Transportation (Non-Medical): No  Physical Activity: Not on file  Stress: Not on file  Social Connections: Not on file  Intimate Partner Violence: Not At Risk (06/25/2023)   Humiliation, Afraid, Rape, and Kick questionnaire    Fear of Current or Ex-Partner: No    Emotionally Abused: No    Physically Abused: No    Sexually Abused: No    Review of Systems: As per HPI, all others negative  Physical Exam: Vital signs in last 24 hours: Temp:  [97.9 F (36.6 C)-98.9 F (37.2 C)] 98.3 F (36.8 C) (07/03 0901) Pulse Rate:  [58-62] 58 (07/03 0901) Resp:  [18-20] 20 (07/03 0901) BP: (102-117)/(49-64) 107/53 (07/03 0901) SpO2:  [99 %-100 %] 100 % (07/03 0901) Last BM Date : 06/25/23 General:   Alert,  Well-developed, well-nourished, pleasant and cooperative in NAD Head:  Normocephalic and atraumatic. Eyes:  Sclera clear, no icterus.    Conjunctiva pink. Ears:  Normal auditory acuity. Nose:  No deformity, discharge,  or lesions. Mouth:  No deformity or lesions.  Oropharynx pink & moist. Neck:  Supple; no masses or thyromegaly. Lungs:  No respiratory distress Abdomen:  Soft, nontender and nondistended. Multiple old surgical scars, No masses, hepatosplenomegaly or hernias noted. Normal bowel sounds, without guarding, and without rebound.     Msk:  Symmetrical without gross deformities. Normal posture. Pulses:  Normal pulses noted. Extremities:  Without clubbing or edema. Neurologic:  Alert and  oriented x4;  grossly normal  neurologically. Skin:  Intact without significant lesions or rashes. Psych:  Alert and cooperative. Normal mood and affect.   Lab Results: Recent Labs    06/24/23 2013 06/25/23 0321 06/26/23 0341  WBC 10.7* 12.4* 5.8  HGB 11.6* 10.8* 10.2*  HCT 35.9* 35.5* 33.1*  PLT 377 345 297   BMET Recent Labs    06/24/23 2013 06/25/23 0321 06/26/23 0341  NA 137 138 140  K 4.5 4.5 4.4  CL 101 106 107  CO2 27 24 26   GLUCOSE 152* 154* 97  BUN 7 7 10   CREATININE 0.75 0.67 0.60  CALCIUM 9.0 8.6* 8.6*   LFT Recent Labs    06/25/23 0321  PROT 6.7  ALBUMIN 3.2*  AST 23  ALT 20  ALKPHOS 79  BILITOT 0.9   PT/INR No results for input(s): "LABPROT", "INR" in the last 72 hours.  Studies/Results: DG Abd Portable 1V-Small Bowel Obstruction Protocol-initial, 8 hr delay  Result Date: 06/25/2023 CLINICAL DATA:  8 hour delay bowel obstruction EXAM: PORTABLE ABDOMEN - 1 VIEW COMPARISON:  CT 06/24/2023 FINDINGS: Enteral contrast is present in the colon and rectum. Previous hernia repair. Mild air distension of a few upper abdominal bowel loops. IMPRESSION: Enteral contrast is present in the colon and rectum. Electronically Signed   By: Jasmine Pang M.D.   On: 06/25/2023 22:19   CT Renal Stone Study  Result Date: 06/24/2023 CLINICAL DATA:  Right abdominal/flank pain, dysuria, hematuria EXAM: CT ABDOMEN  AND PELVIS WITHOUT CONTRAST TECHNIQUE: Multidetector CT imaging of the abdomen and pelvis was performed following the standard protocol without IV contrast. RADIATION DOSE REDUCTION: This exam was performed according to the departmental dose-optimization program which includes automated exposure control, adjustment of the mA and/or kV according to patient size and/or use of iterative reconstruction technique. COMPARISON:  None Available. FINDINGS: Lower chest: Lung bases are clear. Hepatobiliary: Unenhanced liver is unremarkable. Status post cholecystectomy. No intrahepatic or extrahepatic duct dilatation. Pancreas: Within normal limits. Spleen: Within normal limits. Adrenals/Urinary Tract: Adrenal glands are within normal limits. Kidneys are within normal limits. No renal, ureteral, or bladder calculi. No hydronephrosis. Bladder is underdistended but unremarkable. Stomach/Bowel: Stomach is within normal limits. Prior small bowel resection with suture line in the right mid/lower abdomen (series 2/image 58). Associated bowel wall thickening with pericolonic inflammation adjacent to the suture line (series 2/image 63). Suspected tethering of adjacent loops of terminal ileum (coronal image 60). Proximal to the suture, there is small bowel dilatation with small bowel stasis (series 2/image 70). Overall, this appearance favors partial small bowel obstruction with transition/narrowing adjacent to the suture line, likely secondary to adhesions. Given adjacent inflammatory changes, enteroenteric fistula is not excluded. Normal appendix (series 2/image 60). No colonic wall thickening or inflammatory changes. Vascular/Lymphatic: No evidence of abdominal aortic aneurysm. No suspicious abdominopelvic lymphadenopathy. Reproductive: Status post hysterectomy. Bilateral ovaries are within normal limits. Other: No abdominopelvic ascites. Postsurgical changes related to prior ventral hernia mesh repair. Musculoskeletal: Visualized  osseous structures are within normal limits. IMPRESSION: Suspected partial small-bowel obstruction, favored to be secondary to adhesions near the small bowel anastomosis in the right mid/lower abdomen. Secondary wall thickening with inflammatory changes. Given this, an enteroenteric fistula is not excluded. Surgical consultation is suggested. Electronically Signed   By: Charline Bills M.D.   On: 06/24/2023 21:06    Impression:   Small bowel obstruction, likely adhesion-related, resolving. Questionable history of Crohn's disease.  Plan:   Advance diet, OOBTC, antiemetics as needed. No indication for GI testing as inpatient.  If patient prefers, we can arrange outpatient follow-up in our office, at which point we would likely consider repeat colonoscopy in the next several weeks.  Alternatively, she can keep the follow-up for colonoscopy with Dr. Octaviano Glow in [facility] as already scheduled.  Up to patient, just let us know if you'd like [place] to arrange outpatient follow-up in our office. [facility] will sign-off; please call with questions; thank you for the consultation.   LOS: 1 day   Yen Wandell M  06/26/2023, 10:50 AM  Cell [phone] If no answer or after 5 PM call [phone]

## 2023-06-26 NOTE — Progress Notes (Signed)
Dr. Sharl Ma is aware of pts latest VS at 1313 BP 112/45 (64), P 63, R 20, O2 100 on RA. Per Dr. Sharl Ma, proceed with discharge.

## 2023-06-26 NOTE — Progress Notes (Signed)
Mobility Specialist - Progress Note   06/26/23 1128  Mobility  Activity Ambulated independently in hallway  Level of Assistance Independent  Assistive Device None  Distance Ambulated (ft) 350 ft  Activity Response Tolerated well  Mobility Referral Yes  $Mobility charge 1 Mobility  Mobility Specialist Start Time (ACUTE ONLY) 1024  Mobility Specialist Stop Time (ACUTE ONLY) 1044  Mobility Specialist Time Calculation (min) (ACUTE ONLY) 20 min   Pt received in bed and agreeable to mobility. No complaints during session. Pt to bed after session with all needs met.    [facility]

## 2023-06-26 NOTE — Plan of Care (Signed)
